# Patient Record
Sex: Female | Born: 1975 | Race: White | Hispanic: No | Marital: Single | Smoking: Current every day smoker
Health system: Southern US, Community
[De-identification: ages and names within clinical notes are randomized; demographics above are authoritative.]

## PROBLEM LIST (undated history)

## (undated) ENCOUNTER — Inpatient Hospital Stay (HOSPITAL_COMMUNITY): Payer: Self-pay

## (undated) DIAGNOSIS — L0292 Furuncle, unspecified: Secondary | ICD-10-CM

## (undated) DIAGNOSIS — F419 Anxiety disorder, unspecified: Secondary | ICD-10-CM

## (undated) DIAGNOSIS — A4902 Methicillin resistant Staphylococcus aureus infection, unspecified site: Secondary | ICD-10-CM

## (undated) HISTORY — DX: Furuncle, unspecified: L02.92

## (undated) HISTORY — PX: ABCESS DRAINAGE: SHX399

## (undated) HISTORY — DX: Anxiety disorder, unspecified: F41.9

---
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
## 2011-05-26 NOTE — L&D Delivery Note (Signed)
Delivery Note At 12:04 AM a viable female was delivered via  (Presentation: ROA, Vertex ;  ).  APGAR:  8-9 , ; weight 5 lb 15 oz (2693 g).   Placenta status: none , .  Cord: none  with the following complications: none .  Cord pH: none  Anesthesia: Epidural  Episiotomy: None Lacerations:2nd Degree  Suture Repair: 2.0 chromic Est. Blood Loss (mL): 400  Mom to postpartum.  Baby to nursery-stable.  Aleksei Goodlin A 11/18/2011, 12:43 AM

## 2011-06-02 LAB — OB RESULTS CONSOLE ABO/RH: RH Type: POSITIVE

## 2011-06-02 LAB — OB RESULTS CONSOLE HIV ANTIBODY (ROUTINE TESTING): HIV: NONREACTIVE

## 2011-06-02 LAB — OB RESULTS CONSOLE GC/CHLAMYDIA: Gonorrhea: NEGATIVE

## 2011-06-02 LAB — OB RESULTS CONSOLE HEPATITIS B SURFACE ANTIGEN: Hepatitis B Surface Ag: NEGATIVE

## 2011-06-02 LAB — OB RESULTS CONSOLE RUBELLA ANTIBODY, IGM: Rubella: IMMUNE

## 2011-06-02 LAB — OB RESULTS CONSOLE ANTIBODY SCREEN: Antibody Screen: NEGATIVE

## 2011-06-16 ENCOUNTER — Inpatient Hospital Stay (HOSPITAL_COMMUNITY)
Admission: AD | Admit: 2011-06-16 | Discharge: 2011-06-16 | Disposition: A | Discharge status: Home / Self Care | Payer: Medicaid Other | Source: Ambulatory Visit | Attending: Obstetrics | Admitting: Obstetrics

## 2011-06-16 ENCOUNTER — Encounter (HOSPITAL_COMMUNITY): Payer: Self-pay | Admitting: *Deleted

## 2011-06-16 DIAGNOSIS — R51 Headache: Secondary | ICD-10-CM | POA: Insufficient documentation

## 2011-06-16 DIAGNOSIS — O99891 Other specified diseases and conditions complicating pregnancy: Secondary | ICD-10-CM | POA: Insufficient documentation

## 2011-06-16 DIAGNOSIS — O26899 Other specified pregnancy related conditions, unspecified trimester: Secondary | ICD-10-CM

## 2011-06-16 MED ORDER — ONDANSETRON 8 MG PO TBDP: 8.0000 mg | ORAL_TABLET | Freq: Once | ORAL | Status: DC

## 2011-06-16 MED ORDER — DEXAMETHASONE 0.5 MG PO TABS
1.0000 mg | ORAL_TABLET | Freq: Once | ORAL | Status: AC
  Administered 2011-06-16: 1 mg via ORAL
  Filled 2011-06-16: qty 2

## 2011-06-16 MED ORDER — METOCLOPRAMIDE HCL 10 MG PO TABS
10.0000 mg | ORAL_TABLET | Freq: Once | ORAL | Status: AC
  Administered 2011-06-16: 10 mg via ORAL
  Filled 2011-06-16: qty 1

## 2011-06-16 MED ORDER — PROMETHAZINE HCL 25 MG PO TABS: 25.0000 mg | ORAL_TABLET | Freq: Four times a day (QID) | ORAL | Status: AC | PRN

## 2011-06-16 MED ORDER — DIPHENHYDRAMINE HCL 25 MG PO CAPS
25.0000 mg | ORAL_CAPSULE | Freq: Once | ORAL | Status: AC
  Administered 2011-06-16: 25 mg via ORAL
  Filled 2011-06-16: qty 1

## 2011-06-16 MED ORDER — BUTALBITAL-APAP-CAFFEINE 50-325-40 MG PO TABS: 1.0000 | ORAL_TABLET | Freq: Four times a day (QID) | ORAL | Status: AC | PRN

## 2011-06-16 NOTE — Progress Notes (Signed)
Pt states, "I got up this morning and was a little dizzy and had a headache. My work said I had to have a note to return to work since I was pregnant."

## 2011-06-16 NOTE — ED Provider Notes (Signed)
History     Chief Complaint  Patient presents with  . Headache  . Dizziness   HPI  Pt is [redacted] weeks pregnant and presents with headache.  She states that she has had the headache for 2 days.  The only comfort that she has gotten is when she lays down and goes to sleep.  She has had dizziness and pulsating sensation mostly on the left side.  She denies blurred vision, but has felt dizzy and felt like she was going to pass out.  She is a pt of Dr. Thomes Lolling but was not able to get an appointment at the office.  Past Medical History  Diagnosis Date  . No pertinent past medical history     Past Surgical History  Procedure Date  . Knee surgery     left  . Ankle surgery     right    Family History  Problem Relation Age of Onset  . Anesthesia problems Neg Hx     History  Substance Use Topics  . Smoking status: Current Everyday Smoker    Types: Cigarettes  . Smokeless tobacco: Not on file  . Alcohol Use: No    Allergies:  Allergies  Allergen Reactions  . Bactrim Other (See Comments)    Reaction: little sores    Prescriptions prior to admission  Medication Sig Dispense Refill  . acetaminophen (TYLENOL) 500 MG tablet Take 1,000 mg by mouth every 6 (six) hours as needed. Takes for headaches      . Prenatal Vit-Fe Fumarate-FA (PRENATAL MULTIVITAMIN) TABS Take 1 tablet by mouth daily.        Review of Systems  Constitutional: Negative for fever and chills.  HENT: Negative for hearing loss.   Respiratory: Negative for cough.   Cardiovascular: Negative for chest pain.  Gastrointestinal: Negative for heartburn, nausea, vomiting, abdominal pain, diarrhea and constipation.  Genitourinary: Negative for dysuria and urgency.  Neurological: Positive for headaches. Negative for dizziness.  Psychiatric/Behavioral: Negative for depression.   Physical Exam   Blood pressure 98/62, pulse 92, temperature 99.2 F (37.3 C), temperature source Oral, resp. rate 20, height 5\' 5"  (1.651 m),  weight 161 lb (73.029 kg), last menstrual period 02/17/2011.  Physical Exam  Vitals reviewed. Constitutional: She is oriented to person, place, and time. She appears well-developed and well-nourished.  HENT:  Head: Normocephalic.  Eyes: Pupils are equal, round, and reactive to light.  Neck: Normal range of motion. Neck supple.  Cardiovascular: Normal rate.   Respiratory: Effort normal.  GI: Soft.  Musculoskeletal: Normal range of motion.  Neurological: She is alert and oriented to person, place, and time.  Skin: Skin is warm and dry.  Psychiatric: She has a normal mood and affect.    MAU Course  Procedures  Headache protocol given to pt with Benadryl, Decadron and Reglan- pt got improvement of headache Assessment and Plan  Headache in pregnancy D/c home f/u with Dr. Rubin Payor 06/16/2011, 5:49 PM

## 2011-10-09 ENCOUNTER — Other Ambulatory Visit: Payer: Self-pay | Admitting: Obstetrics

## 2011-10-09 DIAGNOSIS — O365990 Maternal care for other known or suspected poor fetal growth, unspecified trimester, not applicable or unspecified: Secondary | ICD-10-CM

## 2011-10-12 ENCOUNTER — Ambulatory Visit (HOSPITAL_COMMUNITY)
Admission: RE | Admit: 2011-10-12 | Discharge: 2011-10-12 | Disposition: A | Discharge status: Home / Self Care | Payer: Medicaid Other | Source: Ambulatory Visit | Attending: Obstetrics | Admitting: Obstetrics

## 2011-10-12 DIAGNOSIS — Z3689 Encounter for other specified antenatal screening: Secondary | ICD-10-CM | POA: Insufficient documentation

## 2011-10-12 DIAGNOSIS — O09529 Supervision of elderly multigravida, unspecified trimester: Secondary | ICD-10-CM | POA: Insufficient documentation

## 2011-10-12 DIAGNOSIS — O365990 Maternal care for other known or suspected poor fetal growth, unspecified trimester, not applicable or unspecified: Secondary | ICD-10-CM

## 2011-10-24 LAB — HM PAP SMEAR

## 2011-11-04 ENCOUNTER — Other Ambulatory Visit: Payer: Self-pay | Admitting: Obstetrics

## 2011-11-12 ENCOUNTER — Encounter (HOSPITAL_COMMUNITY): Payer: Self-pay | Admitting: *Deleted

## 2011-11-12 ENCOUNTER — Telehealth (HOSPITAL_COMMUNITY): Payer: Self-pay | Admitting: *Deleted

## 2011-11-12 NOTE — Telephone Encounter (Signed)
Preadmission screen  

## 2011-11-17 ENCOUNTER — Inpatient Hospital Stay (HOSPITAL_COMMUNITY): Payer: Medicaid Other | Admitting: Anesthesiology

## 2011-11-17 ENCOUNTER — Encounter (HOSPITAL_COMMUNITY): Payer: Self-pay

## 2011-11-17 ENCOUNTER — Inpatient Hospital Stay (HOSPITAL_COMMUNITY)
Admission: RE | Admit: 2011-11-17 | Discharge: 2011-11-20 | DRG: 775 | Disposition: A | Discharge status: Home / Self Care | Payer: Medicaid Other | Source: Ambulatory Visit | Attending: Obstetrics | Admitting: Obstetrics

## 2011-11-17 ENCOUNTER — Encounter (HOSPITAL_COMMUNITY): Payer: Self-pay | Admitting: Anesthesiology

## 2011-11-17 DIAGNOSIS — O09529 Supervision of elderly multigravida, unspecified trimester: Secondary | ICD-10-CM | POA: Diagnosis present

## 2011-11-17 LAB — CBC
HCT: 36.2 % (ref 36.0–46.0)
Hemoglobin: 12.5 g/dL (ref 12.0–15.0)
MCH: 30.9 pg (ref 26.0–34.0)
MCHC: 34.5 g/dL (ref 30.0–36.0)

## 2011-11-17 MED ORDER — NALBUPHINE HCL 10 MG/ML IJ SOLN: 10.0000 mg | Freq: Four times a day (QID) | INTRAMUSCULAR | Status: DC | PRN

## 2011-11-17 MED ORDER — NALBUPHINE HCL 10 MG/ML IJ SOLN: 10.0000 mg | INTRAMUSCULAR | Status: DC | PRN

## 2011-11-17 MED ORDER — OXYCODONE-ACETAMINOPHEN 5-325 MG PO TABS: 1.0000 | ORAL_TABLET | ORAL | Status: DC | PRN

## 2011-11-17 MED ORDER — LACTATED RINGERS IV SOLN
INTRAVENOUS | Status: DC
  Administered 2011-11-17: 125 mL/h via INTRAVENOUS
  Administered 2011-11-17: 1000 mL via INTRAVENOUS
  Administered 2011-11-17: 23:00:00 via INTRAVENOUS

## 2011-11-17 MED ORDER — MISOPROSTOL 25 MCG QUARTER TABLET
25.0000 ug | ORAL_TABLET | Freq: Once | ORAL | Status: AC
  Administered 2011-11-17: 25 ug via VAGINAL
  Filled 2011-11-17: qty 0.25

## 2011-11-17 MED ORDER — IBUPROFEN 600 MG PO TABS: 600.0000 mg | ORAL_TABLET | Freq: Four times a day (QID) | ORAL | Status: DC | PRN

## 2011-11-17 MED ORDER — NALBUPHINE SYRINGE 5 MG/0.5 ML
10.0000 mg | INJECTION | Freq: Four times a day (QID) | INTRAMUSCULAR | Status: DC | PRN
  Administered 2011-11-17: 10 mg via INTRAMUSCULAR
  Filled 2011-11-17: qty 1
  Filled 2011-11-17: qty 2

## 2011-11-17 MED ORDER — LORAZEPAM 2 MG/ML IJ SOLN
0.5000 mg | Freq: Once | INTRAMUSCULAR | Status: AC
  Administered 2011-11-17: 0.5 mg via INTRAVENOUS
  Filled 2011-11-17: qty 1

## 2011-11-17 MED ORDER — FLEET ENEMA 7-19 GM/118ML RE ENEM: 1.0000 | ENEMA | RECTAL | Status: DC | PRN

## 2011-11-17 MED ORDER — OXYTOCIN 40 UNITS IN LACTATED RINGERS INFUSION - SIMPLE MED: 62.5000 mL/h | Freq: Once | INTRAVENOUS | Status: DC

## 2011-11-17 MED ORDER — TERBUTALINE SULFATE 1 MG/ML IJ SOLN: 0.2500 mg | Freq: Once | INTRAMUSCULAR | Status: AC | PRN

## 2011-11-17 MED ORDER — CITRIC ACID-SODIUM CITRATE 334-500 MG/5ML PO SOLN: 30.0000 mL | ORAL | Status: DC | PRN

## 2011-11-17 MED ORDER — DIPHENHYDRAMINE HCL 50 MG/ML IJ SOLN: 12.5000 mg | INTRAMUSCULAR | Status: DC | PRN

## 2011-11-17 MED ORDER — OXYTOCIN BOLUS FROM INFUSION
250.0000 mL | Freq: Once | INTRAVENOUS | Status: DC
  Filled 2011-11-17: qty 500

## 2011-11-17 MED ORDER — ACETAMINOPHEN 325 MG PO TABS: 650.0000 mg | ORAL_TABLET | ORAL | Status: DC | PRN

## 2011-11-17 MED ORDER — LACTATED RINGERS IV SOLN: 500.0000 mL | Freq: Once | INTRAVENOUS | Status: DC

## 2011-11-17 MED ORDER — OXYTOCIN 40 UNITS IN LACTATED RINGERS INFUSION - SIMPLE MED
1.0000 m[IU]/min | INTRAVENOUS | Status: DC
  Administered 2011-11-17: 1 m[IU]/min via INTRAVENOUS
  Administered 2011-11-17: 3 m[IU]/min via INTRAVENOUS
  Administered 2011-11-17: 4 m[IU]/min via INTRAVENOUS
  Administered 2011-11-17: 2 m[IU]/min via INTRAVENOUS
  Filled 2011-11-17: qty 1000

## 2011-11-17 MED ORDER — PHENYLEPHRINE 40 MCG/ML (10ML) SYRINGE FOR IV PUSH (FOR BLOOD PRESSURE SUPPORT): 80.0000 ug | PREFILLED_SYRINGE | INTRAVENOUS | Status: DC | PRN

## 2011-11-17 MED ORDER — LACTATED RINGERS IV SOLN
500.0000 mL | INTRAVENOUS | Status: DC | PRN
  Administered 2011-11-17 (×2): 1000 mL via INTRAVENOUS

## 2011-11-17 MED ORDER — LIDOCAINE HCL (PF) 1 % IJ SOLN
INTRAMUSCULAR | Status: DC | PRN
  Administered 2011-11-17 (×2): 5 mL

## 2011-11-17 MED ORDER — NALBUPHINE SYRINGE 5 MG/0.5 ML
10.0000 mg | INJECTION | INTRAMUSCULAR | Status: DC | PRN
  Administered 2011-11-17: 10 mg via INTRAVENOUS
  Filled 2011-11-17: qty 1

## 2011-11-17 MED ORDER — HYDROXYZINE HCL 50 MG/ML IM SOLN: 50.0000 mg | Freq: Four times a day (QID) | INTRAMUSCULAR | Status: DC | PRN

## 2011-11-17 MED ORDER — ONDANSETRON HCL 4 MG/2ML IJ SOLN: 4.0000 mg | Freq: Four times a day (QID) | INTRAMUSCULAR | Status: DC | PRN

## 2011-11-17 MED ORDER — LIDOCAINE HCL (PF) 1 % IJ SOLN
30.0000 mL | INTRAMUSCULAR | Status: DC | PRN
  Administered 2011-11-18: 30 mL via SUBCUTANEOUS
  Filled 2011-11-17: qty 30

## 2011-11-17 MED ORDER — FENTANYL 2.5 MCG/ML BUPIVACAINE 1/10 % EPIDURAL INFUSION (WH - ANES)
14.0000 mL/h | INTRAMUSCULAR | Status: DC
  Administered 2011-11-17 (×3): 14 mL/h via EPIDURAL
  Filled 2011-11-17 (×3): qty 60

## 2011-11-17 MED ORDER — PHENYLEPHRINE 40 MCG/ML (10ML) SYRINGE FOR IV PUSH (FOR BLOOD PRESSURE SUPPORT)
80.0000 ug | PREFILLED_SYRINGE | INTRAVENOUS | Status: DC | PRN
  Filled 2011-11-17: qty 5

## 2011-11-17 MED ORDER — EPHEDRINE 5 MG/ML INJ: 10.0000 mg | INTRAVENOUS | Status: DC | PRN

## 2011-11-17 MED ORDER — EPHEDRINE 5 MG/ML INJ
10.0000 mg | INTRAVENOUS | Status: DC | PRN
  Filled 2011-11-17: qty 4

## 2011-11-17 NOTE — Progress Notes (Signed)
Lindsay Espinoza is a 36 y.o. G3P1011 at [redacted]w[redacted]d by LMP admitted for induction of labor due to Elective at term.  Subjective:   Objective: BP 111/71  Pulse 79  Temp 98 F (36.7 C) (Oral)  Resp 18  Ht 5\' 5"  (1.651 m)  Wt 78.019 kg (172 lb)  BMI 28.62 kg/m2  SpO2 98%  LMP 02/17/2011 I/O last 3 completed shifts: In: -  Out: 300 [Urine:300] Total I/O In: -  Out: 1000 [Urine:1000]  FHT:  FHR: 150 bpm, variability: moderate,  accelerations:  Present,  decelerations:  Present variables, UC:   regular, every 3 minutes SVE:   Dilation: 9 Effacement (%): 100 Station: 0;+1 Exam by:: [employer] RN  Labs: Lab Results  Component Value Date   WBC 8.9 11/17/2011   HGB 12.5 11/17/2011   HCT 36.2 11/17/2011   MCV 89.4 11/17/2011   PLT 143* 11/17/2011    Assessment / Plan: Augmentation of labor, progressing well  Labor: Progressing normally Preeclampsia:  n/a Fetal Wellbeing:  Category II Pain Control:  Epidural I/D:  n/a Anticipated MOD:  NSVD  Berl Bonfanti A 11/17/2011, 11:33 PM

## 2011-11-17 NOTE — H&P (Signed)
Lindsay Espinoza is a 36 y.o. female presenting for elective IOL.. Maternal Medical History:  Reason for admission: Reason for admission: contractions.  36 yo G3 P1 at 39.0 weeks presents for elective IOL.  Contractions: Frequency: irregular.    Fetal activity: Perceived fetal activity is normal.   Last perceived fetal movement was within the past hour.    Prenatal complications: no prenatal complications Prenatal Complications - Diabetes: none.    OB History    Grav Para Term Preterm Abortions TAB SAB Ect Mult Living   3 1 1  0 1 1 0 0 0 1     Past Medical History  Diagnosis Date  . No pertinent past medical history    Past Surgical History  Procedure Date  . Knee surgery     left  . Ankle surgery     right   Family History: family history includes Alcohol abuse in her paternal grandfather; Diabetes in her paternal aunt and paternal grandmother; Heart attack in her maternal grandfather; Stroke in her paternal grandfather; and Vision loss in her paternal grandmother.  There is no history of Anesthesia problems. Social History:  reports that she has been smoking Cigarettes.  She has been smoking about .5 packs per day. She has never used smokeless tobacco. She reports that she does not drink alcohol or use illicit drugs.   Prenatal Transfer Tool  Maternal Diabetes: No Genetic Screening: Normal Maternal Ultrasounds/Referrals: Normal Fetal Ultrasounds or other Referrals:  None Maternal Substance Abuse:  No Significant Maternal Medications:  None Significant Maternal Lab Results:  Lab values include: Other: see prenatal record Other Comments:  None  Review of Systems  All other systems reviewed and are negative.    Dilation: 2 Effacement (%): 50 Station: -2 Exam by:: dherr rn Blood pressure 108/66, pulse 72, temperature 97.9 F (36.6 C), temperature source Oral, resp. rate 18, height 5\' 5"  (1.651 m), weight 78.019 kg (172 lb), last menstrual period  02/17/2011. Maternal Exam:  Uterine Assessment: Contraction strength is mild.  Contraction frequency is irregular.   Abdomen: Patient reports no abdominal tenderness. Fetal presentation: vertex  Introitus: Normal vulva. Normal vagina.    Physical Exam  Nursing note and vitals reviewed. Constitutional: She is oriented to person, place, and time. She appears well-developed and well-nourished.  HENT:  Head: Normocephalic and atraumatic.  Eyes: Conjunctivae are normal. Pupils are equal, round, and reactive to light.  Neck: Normal range of motion. Neck supple.  Cardiovascular: Normal rate and regular rhythm.   Respiratory: Effort normal and breath sounds normal.  GI: Soft.  Genitourinary: Vagina normal and uterus normal.  Musculoskeletal: Normal range of motion.  Neurological: She is alert and oriented to person, place, and time.  Skin: Skin is warm and dry.  Psychiatric: She has a normal mood and affect. Her behavior is normal. Judgment and thought content normal.    Prenatal labs: ABO, Rh: A/--/-- (01/08 0000) Antibody: Negative (01/08 0000) Rubella: Immune (01/08 0000) RPR: Nonreactive (01/08 0000)  HBsAg: Negative (01/08 0000)  HIV: Non-reactive (01/08 0000)  GBS: Negative (05/31 0000)   Assessment/Plan: 39 weeks.  Multiparity.  Elective IOL.   Lindsay Espinoza A 11/17/2011, 12:06 PM

## 2011-11-17 NOTE — Anesthesia Preprocedure Evaluation (Signed)

## 2011-11-17 NOTE — Anesthesia Procedure Notes (Signed)
Epidural Patient location during procedure: OB Start time: 11/17/2011 1:47 PM  Staffing Anesthesiologist: Brayton Caves R Performed by: anesthesiologist   Preanesthetic Checklist Completed: patient identified, site marked, surgical consent, pre-op evaluation, timeout performed, IV checked, risks and benefits discussed and monitors and equipment checked  Epidural Patient position: sitting Prep: site prepped and draped and DuraPrep Patient monitoring: continuous pulse ox and blood pressure Approach: midline Injection technique: LOR air and LOR saline  Needle:  Needle type: Tuohy  Needle gauge: 17 G Needle length: 9 cm Needle insertion depth: 5 cm cm Catheter type: closed end flexible Catheter size: 19 Gauge Catheter at skin depth: 10 cm Test dose: negative  Assessment Events: blood not aspirated, injection not painful, no injection resistance, negative IV test and no paresthesia  Additional Notes Patient identified.  Risk benefits discussed including failed block, incomplete pain control, headache, nerve damage, paralysis, blood pressure changes, nausea, vomiting, reactions to medication both toxic or allergic, and postpartum back pain.  Patient expressed understanding and wished to proceed.  All questions were answered.  Sterile technique used throughout procedure and epidural site dressed with sterile barrier dressing. No paresthesia or other complications noted.The patient did not experience any signs of intravascular injection such as tinnitus or metallic taste in mouth nor signs of intrathecal spread such as rapid motor block. Please see nursing notes for vital signs.

## 2011-11-17 NOTE — Progress Notes (Signed)
Lindsay Espinoza is a 36 y.o. G3P1011 at [redacted]w[redacted]d by LMP admitted for induction of labor due to Elective at term and multiparity.  Subjective:   Objective: BP 106/63  Pulse 66  Temp 98 F (36.7 C) (Oral)  Resp 18  Ht 5\' 5"  (1.651 m)  Wt 78.019 kg (172 lb)  BMI 28.62 kg/m2  LMP 02/17/2011      FHT:  FHR: 150 bpm, variability: moderate,  accelerations:  Present,  decelerations:  Absent UC:   regular, every 3 minutes SVE:   Dilation: 2.5 Effacement (%): 70 Station: -2 Exam by:: dherr rn  Labs: Lab Results  Component Value Date   WBC 8.9 11/17/2011   HGB 12.5 11/17/2011   HCT 36.2 11/17/2011   MCV 89.4 11/17/2011   PLT 143* 11/17/2011    Assessment / Plan: Induction of labor due to term with favorable cervix and multiparity,  progressing well on pitocin  Labor: Progressing normally Preeclampsia:  no signs or symptoms of toxicity Fetal Wellbeing:  Category I Pain Control:  Nubain I/D:  n/a Anticipated MOD:  NSVD  Lindsay Espinoza A 11/17/2011, 1:18 PM

## 2011-11-18 ENCOUNTER — Encounter (HOSPITAL_COMMUNITY): Payer: Self-pay

## 2011-11-18 LAB — CBC
HCT: 29.9 % — ABNORMAL LOW (ref 36.0–46.0)
Hemoglobin: 10.4 g/dL — ABNORMAL LOW (ref 12.0–15.0)
MCHC: 34.8 g/dL (ref 30.0–36.0)
RBC: 3.32 MIL/uL — ABNORMAL LOW (ref 3.87–5.11)

## 2011-11-18 MED ORDER — ZOLPIDEM TARTRATE 5 MG PO TABS: 5.0000 mg | ORAL_TABLET | Freq: Every evening | ORAL | Status: DC | PRN

## 2011-11-18 MED ORDER — BENZOCAINE-MENTHOL 20-0.5 % EX AERO
1.0000 "application " | INHALATION_SPRAY | CUTANEOUS | Status: DC | PRN
  Administered 2011-11-18: 1 via TOPICAL
  Filled 2011-11-18: qty 56

## 2011-11-18 MED ORDER — ONDANSETRON HCL 4 MG/2ML IJ SOLN: 4.0000 mg | INTRAMUSCULAR | Status: DC | PRN

## 2011-11-18 MED ORDER — IBUPROFEN 600 MG PO TABS
600.0000 mg | ORAL_TABLET | Freq: Four times a day (QID) | ORAL | Status: DC
  Administered 2011-11-18: 600 mg via ORAL
  Filled 2011-11-18: qty 1

## 2011-11-18 MED ORDER — TETANUS-DIPHTH-ACELL PERTUSSIS 5-2.5-18.5 LF-MCG/0.5 IM SUSP: 0.5000 mL | Freq: Once | INTRAMUSCULAR | Status: DC

## 2011-11-18 MED ORDER — METHYLERGONOVINE MALEATE 0.2 MG/ML IJ SOLN: 0.2000 mg | INTRAMUSCULAR | Status: DC | PRN

## 2011-11-18 MED ORDER — DIPHENHYDRAMINE HCL 25 MG PO CAPS: 25.0000 mg | ORAL_CAPSULE | Freq: Four times a day (QID) | ORAL | Status: DC | PRN

## 2011-11-18 MED ORDER — DIBUCAINE 1 % RE OINT: 1.0000 "application " | TOPICAL_OINTMENT | RECTAL | Status: DC | PRN

## 2011-11-18 MED ORDER — WITCH HAZEL-GLYCERIN EX PADS: 1.0000 "application " | MEDICATED_PAD | CUTANEOUS | Status: DC | PRN

## 2011-11-18 MED ORDER — OXYCODONE-ACETAMINOPHEN 5-325 MG PO TABS
1.0000 | ORAL_TABLET | ORAL | Status: DC | PRN
  Administered 2011-11-18: 2 via ORAL
  Administered 2011-11-18: 1 via ORAL
  Administered 2011-11-18 (×2): 2 via ORAL
  Administered 2011-11-18 (×2): 1 via ORAL
  Administered 2011-11-19 (×3): 2 via ORAL
  Administered 2011-11-19: 1 via ORAL
  Administered 2011-11-20 (×3): 2 via ORAL
  Filled 2011-11-18: qty 1
  Filled 2011-11-18 (×3): qty 2
  Filled 2011-11-18: qty 1
  Filled 2011-11-18: qty 2
  Filled 2011-11-18: qty 1
  Filled 2011-11-18 (×6): qty 2

## 2011-11-18 MED ORDER — SIMETHICONE 80 MG PO CHEW: 80.0000 mg | CHEWABLE_TABLET | ORAL | Status: DC | PRN

## 2011-11-18 MED ORDER — PRENATAL MULTIVITAMIN CH
1.0000 | ORAL_TABLET | Freq: Every day | ORAL | Status: DC
  Administered 2011-11-18 – 2011-11-20 (×3): 1 via ORAL
  Filled 2011-11-18 (×3): qty 1

## 2011-11-18 MED ORDER — METHYLERGONOVINE MALEATE 0.2 MG PO TABS: 0.2000 mg | ORAL_TABLET | ORAL | Status: DC | PRN

## 2011-11-18 MED ORDER — OXYTOCIN 40 UNITS IN LACTATED RINGERS INFUSION - SIMPLE MED: 62.5000 mL/h | INTRAVENOUS | Status: DC | PRN

## 2011-11-18 MED ORDER — LANOLIN HYDROUS EX OINT: TOPICAL_OINTMENT | CUTANEOUS | Status: DC | PRN

## 2011-11-18 MED ORDER — MEDROXYPROGESTERONE ACETATE 150 MG/ML IM SUSP: 150.0000 mg | INTRAMUSCULAR | Status: DC | PRN

## 2011-11-18 MED ORDER — SENNOSIDES-DOCUSATE SODIUM 8.6-50 MG PO TABS
2.0000 | ORAL_TABLET | Freq: Every day | ORAL | Status: DC
  Administered 2011-11-18 – 2011-11-19 (×2): 2 via ORAL

## 2011-11-18 MED ORDER — ONDANSETRON HCL 4 MG PO TABS: 4.0000 mg | ORAL_TABLET | ORAL | Status: DC | PRN

## 2011-11-18 NOTE — Anesthesia Postprocedure Evaluation (Signed)
  Anesthesia Post-op Note  Patient: Lindsay Espinoza  Procedure(s) Performed: * No procedures listed *  Patient Location: PACU and Mother/Baby  Anesthesia Type: Epidural  Level of Consciousness: awake, alert  and oriented  Airway and Oxygen Therapy: Patient Spontanous Breathing  Post-op Pain: mild  Post-op Assessment: Patient's Cardiovascular Status Stable, Respiratory Function Stable, No signs of Nausea or vomiting and Pain level controlled  Post-op Vital Signs: stable  Complications: No apparent anesthesia complications

## 2011-11-18 NOTE — Progress Notes (Signed)
Post Partum Day 1 Subjective: no complaints  Objective: Blood pressure 119/78, pulse 73, temperature 98.7 F (37.1 C), temperature source Oral, resp. rate 18, height 5\' 5"  (1.651 m), weight 78.019 kg (172 lb), last menstrual period 02/17/2011, SpO2 97.00%, unknown if currently breastfeeding.  Physical Exam:  General: alert and no distress Lochia: appropriate Uterine Fundus: firm Incision: healing well DVT Evaluation: No evidence of DVT seen on physical exam.   Basename 11/18/11 0524 11/17/11 0640  HGB 10.4* 12.5  HCT 29.9* 36.2    Assessment/Plan: Plan for discharge tomorrow   LOS: 1 day   Branton Einstein A 11/18/2011, 8:27 AM

## 2011-11-18 NOTE — Progress Notes (Signed)
UR chart review completed.  

## 2011-11-19 MED ORDER — LACTATED RINGERS IV SOLN: INTRAVENOUS | Status: DC

## 2011-11-19 MED ORDER — MISOPROSTOL 25 MCG QUARTER TABLET: 25.0000 ug | ORAL_TABLET | ORAL | Status: DC | PRN

## 2011-11-19 MED ORDER — ONDANSETRON HCL 4 MG/2ML IJ SOLN: 4.0000 mg | Freq: Four times a day (QID) | INTRAMUSCULAR | Status: DC | PRN

## 2011-11-19 MED ORDER — IBUPROFEN 600 MG PO TABS: 600.0000 mg | ORAL_TABLET | Freq: Four times a day (QID) | ORAL | Status: DC | PRN

## 2011-11-19 MED ORDER — CITRIC ACID-SODIUM CITRATE 334-500 MG/5ML PO SOLN: 30.0000 mL | ORAL | Status: DC | PRN

## 2011-11-19 MED ORDER — OXYTOCIN 20 UNITS IN LACTATED RINGERS INFUSION - SIMPLE: 125.0000 mL/h | Freq: Once | INTRAVENOUS | Status: DC

## 2011-11-19 MED ORDER — ACETAMINOPHEN 325 MG PO TABS: 650.0000 mg | ORAL_TABLET | ORAL | Status: DC | PRN

## 2011-11-19 MED ORDER — OXYCODONE-ACETAMINOPHEN 5-325 MG PO TABS: 1.0000 | ORAL_TABLET | ORAL | Status: DC | PRN

## 2011-11-19 MED ORDER — OXYTOCIN BOLUS FROM INFUSION
500.0000 mL | Freq: Once | INTRAVENOUS | Status: DC
  Filled 2011-11-19: qty 500

## 2011-11-19 MED ORDER — BUTORPHANOL TARTRATE 2 MG/ML IJ SOLN: 1.0000 mg | INTRAMUSCULAR | Status: DC | PRN

## 2011-11-19 MED ORDER — LACTATED RINGERS IV SOLN: 500.0000 mL | INTRAVENOUS | Status: DC | PRN

## 2011-11-19 MED ORDER — HYDROXYZINE HCL 50 MG PO TABS: 50.0000 mg | ORAL_TABLET | Freq: Four times a day (QID) | ORAL | Status: DC | PRN

## 2011-11-19 MED ORDER — LIDOCAINE HCL (PF) 1 % IJ SOLN: 30.0000 mL | INTRAMUSCULAR | Status: DC | PRN

## 2011-11-19 MED ORDER — TERBUTALINE SULFATE 1 MG/ML IJ SOLN: 0.2500 mg | Freq: Once | INTRAMUSCULAR | Status: DC | PRN

## 2011-11-19 MED ORDER — HYDROXYZINE HCL 50 MG/ML IM SOLN: 50.0000 mg | Freq: Four times a day (QID) | INTRAMUSCULAR | Status: DC | PRN

## 2011-11-19 NOTE — Discharge Summary (Signed)
Obstetric Discharge Summary Reason for Admission: induction of labor Prenatal Procedures: ultrasound Intrapartum Procedures: spontaneous vaginal delivery Postpartum Procedures: none Complications-Operative and Postpartum: none Hemoglobin  Date Value Range Status  11/18/2011 10.4* 12.0 - 15.0 g/dL Final     REPEATED TO VERIFY     DELTA CHECK NOTED     HCT  Date Value Range Status  11/18/2011 29.9* 36.0 - 46.0 % Final    Physical Exam:  General: alert and no distress Lochia: appropriate Uterine Fundus: firm Incision: none DVT Evaluation: No evidence of DVT seen on physical exam.  Discharge Diagnoses: Term Pregnancy-delivered  Discharge Information: Date: 11/19/2011 Activity: pelvic rest Diet: routine Medications: PNV, Colace and Percocet Condition: stable Instructions: refer to practice specific booklet Discharge to: home Follow-up Information    Follow up with Lindsay Espinoza A, MD. Schedule an appointment as soon as possible for a visit in 6 weeks.   Contact information:   [address] [place] [address] [phone]          Newborn Data: Live born female  Birth Weight: 5 lb 15 oz (2693 g) APGAR: 8, 9  Home with mother.  Rider Ermis A 11/19/2011, 7:35 AM

## 2011-11-19 NOTE — Progress Notes (Signed)
Post Partum Day 2 Subjective: no complaints  Objective: Blood pressure 107/67, pulse 64, temperature 97.7 F (36.5 C), temperature source Oral, resp. rate 18, height 5\' 5"  (1.651 m), weight 78.019 kg (172 lb), last menstrual period 02/17/2011, SpO2 97.00%, unknown if currently breastfeeding.  Physical Exam:  General: alert and no distress Lochia: appropriate Uterine Fundus: firm Incision: none DVT Evaluation: No evidence of DVT seen on physical exam.   Basename 11/18/11 0524 11/17/11 0640  HGB 10.4* 12.5  HCT 29.9* 36.2    Assessment/Plan: Discharge home   LOS: 2 days   Lindsay Espinoza A 11/19/2011, 7:26 AM

## 2012-07-07 ENCOUNTER — Emergency Department (HOSPITAL_COMMUNITY)
Admission: EM | Admit: 2012-07-07 | Discharge: 2012-07-07 | Disposition: A | Discharge status: Home / Self Care | Payer: Self-pay | Attending: Emergency Medicine | Admitting: Emergency Medicine

## 2012-07-07 ENCOUNTER — Encounter (HOSPITAL_COMMUNITY): Payer: Self-pay | Admitting: Nurse Practitioner

## 2012-07-07 DIAGNOSIS — F17200 Nicotine dependence, unspecified, uncomplicated: Secondary | ICD-10-CM | POA: Insufficient documentation

## 2012-07-07 DIAGNOSIS — L0291 Cutaneous abscess, unspecified: Secondary | ICD-10-CM

## 2012-07-07 DIAGNOSIS — R5381 Other malaise: Secondary | ICD-10-CM | POA: Insufficient documentation

## 2012-07-07 DIAGNOSIS — R42 Dizziness and giddiness: Secondary | ICD-10-CM | POA: Insufficient documentation

## 2012-07-07 DIAGNOSIS — R5383 Other fatigue: Secondary | ICD-10-CM | POA: Insufficient documentation

## 2012-07-07 DIAGNOSIS — L03319 Cellulitis of trunk, unspecified: Secondary | ICD-10-CM | POA: Insufficient documentation

## 2012-07-07 DIAGNOSIS — L02219 Cutaneous abscess of trunk, unspecified: Secondary | ICD-10-CM | POA: Insufficient documentation

## 2012-07-07 DIAGNOSIS — Z8614 Personal history of Methicillin resistant Staphylococcus aureus infection: Secondary | ICD-10-CM | POA: Insufficient documentation

## 2012-07-07 LAB — CBC WITH DIFFERENTIAL/PLATELET
Basophils Relative: 1 % (ref 0–1)
Eosinophils Absolute: 0.2 10*3/uL (ref 0.0–0.7)
HCT: 35.8 % — ABNORMAL LOW (ref 36.0–46.0)
Hemoglobin: 12 g/dL (ref 12.0–15.0)
Lymphs Abs: 1.6 10*3/uL (ref 0.7–4.0)
MCH: 30.1 pg (ref 26.0–34.0)
MCHC: 33.5 g/dL (ref 30.0–36.0)
Monocytes Absolute: 0.6 10*3/uL (ref 0.1–1.0)
Monocytes Relative: 10 % (ref 3–12)
Neutro Abs: 4.1 10*3/uL (ref 1.7–7.7)
Neutrophils Relative %: 63 % (ref 43–77)
RBC: 3.99 MIL/uL (ref 3.87–5.11)

## 2012-07-07 MED ORDER — OXYCODONE-ACETAMINOPHEN 5-325 MG PO TABS
1.0000 | ORAL_TABLET | Freq: Once | ORAL | Status: AC
  Administered 2012-07-07: 1 via ORAL
  Filled 2012-07-07: qty 1

## 2012-07-07 MED ORDER — CLINDAMYCIN HCL 150 MG PO CAPS: 150.0000 mg | ORAL_CAPSULE | Freq: Four times a day (QID) | ORAL | Status: DC

## 2012-07-07 MED ORDER — HYDROCODONE-ACETAMINOPHEN 5-325 MG PO TABS: 1.0000 | ORAL_TABLET | Freq: Four times a day (QID) | ORAL | Status: DC | PRN

## 2012-07-07 NOTE — ED Notes (Signed)
Pt has hx of MRSA and abscess, has abscess on left side.

## 2012-07-07 NOTE — ED Provider Notes (Signed)
  Medical screening examination/treatment/procedure(s) were performed by non-physician practitioner and as supervising physician I was immediately available for consultation/collaboration.  On my exam the patient was in no distress.  We discussed the need for ongoing evaluation of her repetitive abscesses,  the wound was incised and drained by the nurse practitioner  I saw the ECG (if appropriate), relevant labs and studies - I agree with the interpretation.    Gerhard Munch, MD 07/07/12 [phone]

## 2012-07-07 NOTE — ED Provider Notes (Signed)
History    This chart was scribed for non-physician practitioner working with Gerhard Munch, MD by Frederik Pear, ED Scribe. This patient was seen in room TR11C/TR11C and the patient's care was started at 1856.   CSN: [id number]  Arrival date & time 07/07/12  1813   First MD Initiated Contact with Patient 07/07/12 1856      Chief Complaint  Patient presents with  . Abscess    (Consider location/radiation/quality/duration/timing/severity/associated sxs/prior treatment) Patient is a 37 y.o. female presenting with abscess. The history is provided by the patient. No language interpreter was used.  Abscess Location:  Torso Torso abscess location:  L axilla and L chest Abscess quality: induration, redness and warmth   Red streaking: no   Duration:  1 week Progression:  Worsening Chronicity:  Recurrent Associated symptoms: fatigue     Lindsay Espinoza is a 37 y.o. 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 She also complains of intermittent, mild fever, dizziness, and lightheadedness. She reports that she just moved to the area and the last abscess was removed surgically at [facility]. She reports that she is allergic to bactrim and breaks out in a rash. She states that she has the best success with clindamycin and doxycycline.    Past Medical History  Diagnosis Date  . No pertinent past medical history     Past Surgical History  Procedure Laterality Date  . Knee surgery      left  . Ankle surgery      right    Family History  Problem Relation Age of Onset  . Anesthesia problems Neg Hx   . Diabetes Paternal Aunt   . Heart attack Maternal Grandfather   . Vision loss Paternal Grandmother     cataracts  . Diabetes Paternal Grandmother   . Alcohol abuse Paternal Grandfather   . Stroke Paternal Grandfather      History  Substance Use Topics  . Smoking status: Current Every Day Smoker -- 0.50 packs/day    Types: Cigarettes  . Smokeless tobacco: Never Used  . Alcohol Use: No    OB History   Grav Para Term Preterm Abortions TAB SAB Ect Mult Living   3 2 2  0 1 1 0 0 0 2      Review of Systems  Constitutional: Positive for fatigue.  Skin:       Abscess  Neurological: Positive for dizziness and light-headedness.  All other systems reviewed and are negative.    Allergies  Bactrim  Home Medications   Current Outpatient Rx  Name  Route  Sig  Dispense  Refill  . acetaminophen (TYLENOL) 325 MG tablet   Oral   Take 650 mg by mouth every 6 (six) hours as needed for pain.         Marland Kitchen doxycycline (VIBRA-TABS) 100 MG tablet   Oral   Take 100 mg by mouth 2 (two) times daily.         . naproxen sodium (ANAPROX) 220 MG tablet   Oral   Take 220 mg by mouth 2 (two) times daily with a meal.           BP 119/80  Pulse 83  Temp(Src) 98.4 F (36.9 C) (Oral)  Resp 16  Ht 5\' 5"  (1.651 m)  Wt 150 lb (68.04 kg)  BMI 24.96 kg/m2  SpO2 98%  LMP 07/07/2012  Breastfeeding? No  Physical Exam  Nursing note and vitals reviewed. Constitutional: She is oriented to person, place, and time. She appears well-developed and well-nourished. No distress.  HENT:  Head: Normocephalic and atraumatic.  Eyes: EOM are normal. Pupils are equal, round, and reactive to light.  Neck: Normal range of motion. Neck supple. No tracheal deviation present.  Cardiovascular: Normal rate.   Pulmonary/Chest: Effort normal. No respiratory distress.  There is a 4x3 cm abscess with mild warmth, and moderate induration and erythema to the left chest wall just below the axilla.   Abdominal: Soft. She exhibits no distension.  Musculoskeletal: Normal range of motion. She exhibits no edema.  Neurological: She is alert and oriented to person, place, and time.  Skin: Skin is warm and dry. There is erythema.   Psychiatric: She has a normal mood and affect. Her behavior is normal.    ED Course  Procedures (including critical care time)  DIAGNOSTIC STUDIES: Oxygen Saturation is 98% on room air, normal by my interpretation.    COORDINATION OF CARE:  18:58- Discussed planned course of treatment with the patient, including a CBC and an I&D, who is agreeable at this time.  19:15- Medication Orders- oxycodone-acetaminophen (percocet/roxicet) 50325 mg per tablet 1 tablet- once.   22:50- INCISION AND DRAINAGE PROCEDURE NOTE: Patient identification was confirmed and verbal consent was obtained. This procedure was performed by Felicie Morn, NP at 10:50 PM. Site: Left chest wall just below the axilla.  Sterile procedures observed  Needle size: N/A Anesthetic used (type and amt): 4 cc of 2% plain lidocaine.  Blade size: 11 Drainage: copious amounts of purulent clear discharge Complexity: Complex Packing used: None Site anesthetized, incision made over site, wound drained and explored loculations, rinsed with copious amounts of normal saline, covered with dry, sterile dressing.  Pt tolerated procedure well without complications.  Instructions for care discussed verbally and pt provided with additional written instructions for homecare and f/u.   Results for orders placed during the hospital encounter of 07/07/12  CBC WITH DIFFERENTIAL      Result Value Range   WBC 6.5  4.0 - 10.5 K/uL   RBC 3.99  3.87 - 5.11 MIL/uL   Hemoglobin 12.0  12.0 - 15.0 g/dL   HCT 16.1 (*) 09.6 - 04.5 %   MCV 89.7  78.0 - 100.0 fL   MCH 30.1  26.0 - 34.0 pg   MCHC 33.5  30.0 - 36.0 g/dL   RDW 40.9  81.1 - 91.4 %   Platelets 200  150 - 400 K/uL   Neutrophils Relative 63  43 - 77 %   Neutro Abs 4.1  1.7 - 7.7 K/uL   Lymphocytes Relative 24  12 - 46 %   Lymphs Abs 1.6  0.7 - 4.0 K/uL   Monocytes Relative 10  3 - 12 %   Monocytes Absolute 0.6  0.1 - 1.0 K/uL   Eosinophils Relative 3  0 - 5 %   Eosinophils  Absolute 0.2  0.0 - 0.7 K/uL   Basophils Relative 1  0 - 1 %   Basophils Absolute 0.0  0.0 - 0.1 K/uL    Labs Reviewed - No data to display No results found.   No diagnosis found.  Patient discussed with and seen by Dr. Jeraldine Loots.  Will I&D, culture.  Home with MRSA instructions.  Prescription for clindamycin to fill if she develops fever.  MDM   I personally performed the services  described in this documentation, which was scribed in my presence. The recorded information has been reviewed and is accurate.         Jimmye Norman, NP 07/07/12 [phone]

## 2012-07-07 NOTE — ED Notes (Signed)
Dry dressing placed over abscess site

## 2012-07-11 LAB — CULTURE, ROUTINE-ABSCESS

## 2012-07-12 NOTE — ED Notes (Signed)
+   urine Patient treated with Clindamhycin-senstitive to same-Chart appended per protocol MD.

## 2013-05-05 ENCOUNTER — Other Ambulatory Visit: Payer: Self-pay | Admitting: Internal Medicine

## 2013-05-05 ENCOUNTER — Telehealth: Payer: Self-pay | Admitting: Internal Medicine

## 2013-05-05 DIAGNOSIS — J019 Acute sinusitis, unspecified: Secondary | ICD-10-CM

## 2013-05-05 MED ORDER — AZITHROMYCIN 250 MG PO TABS: ORAL_TABLET | ORAL | Status: DC

## 2013-05-05 NOTE — Telephone Encounter (Signed)
Sinus infection. Plan- Zpak sent to [facility] pharmacy

## 2013-06-22 ENCOUNTER — Ambulatory Visit (INDEPENDENT_AMBULATORY_CARE_PROVIDER_SITE_OTHER): Payer: 59 | Admitting: Internal Medicine

## 2013-06-22 ENCOUNTER — Encounter: Payer: Self-pay | Admitting: Internal Medicine

## 2013-06-22 VITALS — BP 112/82 | HR 78 | Temp 98.8°F | Ht 65.0 in | Wt 145.1 lb

## 2013-06-22 DIAGNOSIS — F411 Generalized anxiety disorder: Secondary | ICD-10-CM

## 2013-06-22 DIAGNOSIS — Z0000 Encounter for general adult medical examination without abnormal findings: Secondary | ICD-10-CM

## 2013-06-22 DIAGNOSIS — L732 Hidradenitis suppurativa: Secondary | ICD-10-CM | POA: Insufficient documentation

## 2013-06-22 DIAGNOSIS — F419 Anxiety disorder, unspecified: Secondary | ICD-10-CM | POA: Insufficient documentation

## 2013-06-22 DIAGNOSIS — F17200 Nicotine dependence, unspecified, uncomplicated: Secondary | ICD-10-CM

## 2013-06-22 DIAGNOSIS — L0293 Carbuncle, unspecified: Secondary | ICD-10-CM

## 2013-06-22 DIAGNOSIS — L0292 Furuncle, unspecified: Secondary | ICD-10-CM

## 2013-06-22 MED ORDER — VARENICLINE TARTRATE 0.5 MG X 11 & 1 MG X 42 PO MISC: ORAL | Status: DC

## 2013-06-22 MED ORDER — DESVENLAFAXINE SUCCINATE ER 50 MG PO TB24: 50.0000 mg | ORAL_TABLET | Freq: Every day | ORAL | Status: DC

## 2013-06-22 MED ORDER — ALPRAZOLAM 1 MG PO TABS: 0.5000 mg | ORAL_TABLET | Freq: Two times a day (BID) | ORAL | Status: DC | PRN

## 2013-06-22 MED ORDER — DOXYCYCLINE HYCLATE 100 MG PO TABS: 100.0000 mg | ORAL_TABLET | Freq: Two times a day (BID) | ORAL | Status: DC

## 2013-06-22 NOTE — Progress Notes (Signed)
Pre-visit discussion using our clinic review tool. No additional management support is needed unless otherwise documented below in the visit note.  

## 2013-06-22 NOTE — Patient Instructions (Addendum)
It was good to see you today.  We have reviewed your prior records including labs and tests today  Health Maintenance reviewed - all recommended immunizations and age-appropriate screenings are up-to-date.  Test(s) ordered today. Return in the AM for your labs. Your results will be released to [place] (or called to you) after review, usually within 72hours after test completion. If any changes need to be made, you will be notified at that same time.  Medications reviewed and updated,  Pristiq, Chantix and Xanax -no other changes recommended at this time. Your prescription(s) have been submitted to your pharmacy. Please take as directed and contact our office if you believe you are having problem(s) with the medication(s).  Please schedule followup in 3 months for mood and smoking cessation check, call sooner if problems. Health Maintenance, Female A healthy lifestyle and preventative care can promote health and wellness.  Maintain regular health, dental, and eye exams.  Eat a healthy diet. Foods like vegetables, fruits, whole grains, low-fat dairy products, and lean protein foods contain the nutrients you need without too many calories. Decrease your intake of foods high in solid fats, added sugars, and salt. Get information about a proper diet from your caregiver, if necessary.  Regular physical exercise is one of the most important things you can do for your health. Most adults should get at least 150 minutes of moderate-intensity exercise (any activity that increases your heart rate and causes you to sweat) each week. In addition, most adults need muscle-strengthening exercises on 2 or more days a week.   Maintain a healthy weight. The body mass index (BMI) is a screening tool to identify possible weight problems. It provides an estimate of body fat based on height and weight. Your caregiver can help determine your BMI, and can help you achieve or maintain a healthy weight. For adults 20  years and older:  A BMI below 18.5 is considered underweight.  A BMI of 18.5 to 24.9 is normal.  A BMI of 25 to 29.9 is considered overweight.  A BMI of 30 and above is considered obese.  Maintain normal blood lipids and cholesterol by exercising and minimizing your intake of saturated fat. Eat a balanced diet with plenty of fruits and vegetables. Blood tests for lipids and cholesterol should begin at age 53 and be repeated every 5 years. If your lipid or cholesterol levels are high, you are over 50, or you are a high risk for heart disease, you may need your cholesterol levels checked more frequently.Ongoing high lipid and cholesterol levels should be treated with medicines if diet and exercise are not effective.  If you smoke, find out from your caregiver how to quit. If you do not use tobacco, do not start.  Lung cancer screening is recommended for adults aged 18 80 years who are at high risk for developing lung cancer because of a history of smoking. Yearly low-dose computed tomography (CT) is recommended for people who have at least a 30-pack-year history of smoking and are a current smoker or have quit within the past 15 years. A pack year of smoking is smoking an average of 1 pack of cigarettes a day for 1 year (for example: 1 pack a day for 30 years or 2 packs a day for 15 years). Yearly screening should continue until the smoker has stopped smoking for at least 15 years. Yearly screening should also be stopped for people who develop a health problem that would prevent them from having lung cancer  treatment.  If you are pregnant, do not drink alcohol. If you are breastfeeding, be very cautious about drinking alcohol. If you are not pregnant and choose to drink alcohol, do not exceed 1 drink per day. One drink is considered to be 12 ounces (355 mL) of beer, 5 ounces (148 mL) of wine, or 1.5 ounces (44 mL) of liquor.  Avoid use of street drugs. Do not share needles with anyone. Ask for help  if you need support or instructions about stopping the use of drugs.  High blood pressure causes heart disease and increases the risk of stroke. Blood pressure should be checked at least every 1 to 2 years. Ongoing high blood pressure should be treated with medicines, if weight loss and exercise are not effective.  If you are 6 to 37 years old, ask your caregiver if you should take aspirin to prevent strokes.  Diabetes screening involves taking a blood sample to check your fasting blood sugar level. This should be done once every 3 years, after age 34, if you are within normal weight and without risk factors for diabetes. Testing should be considered at a younger age or be carried out more frequently if you are overweight and have at least 1 risk factor for diabetes.  Breast cancer screening is essential preventative care for women. You should practice "breast self-awareness." This means understanding the normal appearance and feel of your breasts and may include breast self-examination. Any changes detected, no matter how small, should be reported to a caregiver. Women in their 8s and 30s should have a clinical breast exam (CBE) by a caregiver as part of a regular health exam every 1 to 3 years. After age 87, women should have a CBE every year. Starting at age 71, women should consider having a mammogram (breast X-ray) every year. Women who have a family history of breast cancer should talk to their caregiver about genetic screening. Women at a high risk of breast cancer should talk to their caregiver about having an MRI and a mammogram every year.  Breast cancer gene (BRCA)-related cancer risk assessment is recommended for women who have family members with BRCA-related cancers. BRCA-related cancers include breast, ovarian, tubal, and peritoneal cancers. Having family members with these cancers may be associated with an increased risk for harmful changes (mutations) in the breast cancer genes BRCA1 and  BRCA2. Results of the assessment will determine the need for genetic counseling and BRCA1 and BRCA2 testing.  The Pap test is a screening test for cervical cancer. Women should have a Pap test starting at age 55. Between ages 7 and 64, Pap tests should be repeated every 2 years. Beginning at age 7, you should have a Pap test every 3 years as long as the past 3 Pap tests have been normal. If you had a hysterectomy for a problem that was not cancer or a condition that could lead to cancer, then you no longer need Pap tests. If you are between ages 41 and 86, and you have had normal Pap tests going back 10 years, you no longer need Pap tests. If you have had past treatment for cervical cancer or a condition that could lead to cancer, you need Pap tests and screening for cancer for at least 20 years after your treatment. If Pap tests have been discontinued, risk factors (such as a new sexual partner) need to be reassessed to determine if screening should be resumed. Some women have medical problems that increase the chance of  getting cervical cancer. In these cases, your caregiver may recommend more frequent screening and Pap tests.  The human papillomavirus (HPV) test is an additional test that may be used for cervical cancer screening. The HPV test looks for the virus that can cause the cell changes on the cervix. The cells collected during the Pap test can be tested for HPV. The HPV test could be used to screen women aged 31 years and older, and should be used in women of any age who have unclear Pap test results. After the age of 66, women should have HPV testing at the same frequency as a Pap test.  Colorectal cancer can be detected and often prevented. Most routine colorectal cancer screening begins at the age of 50 and continues through age 60. However, your caregiver may recommend screening at an earlier age if you have risk factors for colon cancer. On a yearly basis, your caregiver may provide home  test kits to check for hidden blood in the stool. Use of a small camera at the end of a tube, to directly examine the colon (sigmoidoscopy or colonoscopy), can detect the earliest forms of colorectal cancer. Talk to your caregiver about this at age 51, when routine screening begins. Direct examination of the colon should be repeated every 5 to 10 years through age 8, unless early forms of pre-cancerous polyps or small growths are found.  Hepatitis C blood testing is recommended for all people born from 18 through 1964 and any individual with known risks for hepatitis C.  Practice safe sex. Use condoms and avoid high-risk sexual practices to reduce the spread of sexually transmitted infections (STIs). Sexually active women aged 49 and younger should be checked for Chlamydia, which is a common sexually transmitted infection. Older women with new or multiple partners should also be tested for Chlamydia. Testing for other STIs is recommended if you are sexually active and at increased risk.  Osteoporosis is a disease in which the bones lose minerals and strength with aging. This can result in serious bone fractures. The risk of osteoporosis can be identified using a bone density scan. Women ages 30 and over and women at risk for fractures or osteoporosis should discuss screening with their caregivers. Ask your caregiver whether you should be taking a calcium supplement or vitamin D to reduce the rate of osteoporosis.  Menopause can be associated with physical symptoms and risks. Hormone replacement therapy is available to decrease symptoms and risks. You should talk to your caregiver about whether hormone replacement therapy is right for you.  Use sunscreen. Apply sunscreen liberally and repeatedly throughout the day. You should seek shade when your shadow is shorter than you. Protect yourself by wearing long sleeves, pants, a wide-brimmed hat, and sunglasses year round, whenever you are outdoors.  Notify  your caregiver of new moles or changes in moles, especially if there is a change in shape or color. Also notify your caregiver if a mole is larger than the size of a pencil eraser.  Stay current with your immunizations. Document Released: 11/24/2010 Document Revised: 09/05/2012 Document Reviewed: 11/24/2010 [facility] Patient Information 2014 [place]. Smoking Cessation Quitting smoking is important to your health and has many advantages. However, it is not always easy to quit since nicotine is a very addictive drug. Often times, people try 3 times or more before being able to quit. This document explains the best ways for you to prepare to quit smoking. Quitting takes hard work and a lot of effort,  but you can do it. ADVANTAGES OF QUITTING SMOKING  You will live longer, feel better, and live better.  Your body will feel the impact of quitting smoking almost immediately.  Within 20 minutes, blood pressure decreases. Your pulse returns to its normal level.  After 8 hours, carbon monoxide levels in the blood return to normal. Your oxygen level increases.  After 24 hours, the chance of having a heart attack starts to decrease. Your breath, hair, and body stop smelling like smoke.  After 48 hours, damaged nerve endings begin to recover. Your sense of taste and smell improve.  After 72 hours, the body is virtually free of nicotine. Your bronchial tubes relax and breathing becomes easier.  After 2 to 12 weeks, lungs can hold more air. Exercise becomes easier and circulation improves.  The risk of having a heart attack, stroke, cancer, or lung disease is greatly reduced.  After 1 year, the risk of coronary heart disease is cut in half.  After 5 years, the risk of stroke falls to the same as a nonsmoker.  After 10 years, the risk of lung cancer is cut in half and the risk of other cancers decreases significantly.  After 15 years, the risk of coronary heart disease drops, usually to the  level of a nonsmoker.  If you are pregnant, quitting smoking will improve your chances of having a healthy baby.  The people you live with, especially any children, will be healthier.  You will have extra money to spend on things other than cigarettes. QUESTIONS TO THINK ABOUT BEFORE ATTEMPTING TO QUIT You may want to talk about your answers with your caregiver.  Why do you want to quit?  If you tried to quit in the past, what helped and what did not?  What will be the most difficult situations for you after you quit? How will you plan to handle them?  Who can help you through the tough times? Your family? Friends? A caregiver?  What pleasures do you get from smoking? What ways can you still get pleasure if you quit? Here are some questions to ask your caregiver:  How can you help me to be successful at quitting?  What medicine do you think would be best for me and how should I take it?  What should I do if I need more help?  What is smoking withdrawal like? How can I get information on withdrawal? GET READY  Set a quit date.  Change your environment by getting rid of all cigarettes, ashtrays, matches, and lighters in your home, car, or work. Do not let people smoke in your home.  Review your past attempts to quit. Think about what worked and what did not. GET SUPPORT AND ENCOURAGEMENT You have a better chance of being successful if you have help. You can get support in many ways.  Tell your family, friends, and co-workers that you are going to quit and need their support. Ask them not to smoke around you.  Get individual, group, or telephone counseling and support. Programs are available at [employer] and health centers. Call your local health department for information about programs in your area.  Spiritual beliefs and practices may help some smokers quit.  Download a "quit meter" on your computer to keep track of quit statistics, such as how long you have gone  without smoking, cigarettes not smoked, and money saved.  Get a self-help book about quitting smoking and staying off of tobacco. [place]  BEHAVIORS  Distract yourself from urges to smoke. Talk to someone, go for a walk, or occupy your time with a task.  Change your normal routine. Take a different route to work. Drink tea instead of coffee. Eat breakfast in a different place.  Reduce your stress. Take a hot bath, exercise, or read a book.  Plan something enjoyable to do every day. Reward yourself for not smoking.  Explore interactive web-based programs that specialize in helping you quit. GET MEDICINE AND USE IT CORRECTLY Medicines can help you stop smoking and decrease the urge to smoke. Combining medicine with the above behavioral methods and support can greatly increase your chances of successfully quitting smoking.  Nicotine replacement therapy helps deliver nicotine to your body without the negative effects and risks of smoking. Nicotine replacement therapy includes nicotine gum, lozenges, inhalers, nasal sprays, and skin patches. Some may be available over-the-counter and others require a prescription.  Antidepressant medicine helps people abstain from smoking, but how this works is unknown. This medicine is available by prescription.  Nicotinic receptor partial agonist medicine simulates the effect of nicotine in your brain. This medicine is available by prescription. Ask your caregiver for advice about which medicines to use and how to use them based on your health history. Your caregiver will tell you what side effects to look out for if you choose to be on a medicine or therapy. Carefully read the information on the package. Do not use any other product containing nicotine while using a nicotine replacement product.  RELAPSE OR DIFFICULT SITUATIONS Most relapses occur within the first 3 months after quitting. Do not be discouraged if you start smoking again. Remember,  most people try several times before finally quitting. You may have symptoms of withdrawal because your body is used to nicotine. You may crave cigarettes, be irritable, feel very hungry, cough often, get headaches, or have difficulty concentrating. The withdrawal symptoms are only temporary. They are strongest when you first quit, but they will go away within 10 14 days. To reduce the chances of relapse, try to:  Avoid drinking alcohol. Drinking lowers your chances of successfully quitting.  Reduce the amount of caffeine you consume. Once you quit smoking, the amount of caffeine in your body increases and can give you symptoms, such as a rapid heartbeat, sweating, and anxiety.  Avoid smokers because they can make you want to smoke.  Do not let weight gain distract you. Many smokers will gain weight when they quit, usually less than 10 pounds. Eat a healthy diet and stay active. You can always lose the weight gained after you quit.  Find ways to improve your mood other than smoking. FOR MORE INFORMATION  www.smokefree.gov  Document Released: 05/05/2001 Document Revised: 11/10/2011 Document Reviewed: 08/20/2011 [facility] Patient Information 2014 [place], [place].

## 2013-06-22 NOTE — Progress Notes (Signed)
Subjective:    Patient ID: Lindsay Espinoza, female    DOB: 1975-07-21, 38 y.o.   MRN: [id number]  HPI  patient is here today for annual physical. Patient feels well in general  Also reviewed chronic medical issues and current concerns:  Anxiety - chronic symptoms - remotely on Paxil, Wellbutrin, pristiq, Brintellix and prn xanax  -increasing symptoms in the past 18 months due to unexpected pregnancy and subsequent  71-month-old child - denies SI/HI  Tobacco abuse -variable success at cessation efforts in past -interested in trying Chantix -   Recurrent boils. History of MSSA and MRSA culture positive. Generally occur at axilla as well as groin and buttocks -new episode in past week, began on doxycycline for same with improvement in redness, decreased in size and decrease in tenderness   Past Medical History  Diagnosis Date  . Boils     hx MRSA   Family History  Problem Relation Age of Onset  . Anesthesia problems Neg Hx   . Diabetes Paternal Aunt 40  . Heart attack Maternal Grandfather 45    sudden  . Vision loss Paternal Grandmother     cataracts  . Diabetes Paternal Grandmother 47  . Alcohol abuse Paternal Grandfather   . Stroke Paternal Grandfather 71   History  Substance Use Topics  . Smoking status: Current Every Day Smoker -- 0.50 packs/day    Types: Cigarettes  . Smokeless tobacco: Never Used  . Alcohol Use: No    Review of Systems  Constitutional: Negative for fatigue and unexpected weight change.  Respiratory: Negative for cough, shortness of breath and wheezing.   Cardiovascular: Negative for chest pain, palpitations and leg swelling.  Gastrointestinal: Negative for nausea, abdominal pain and diarrhea.  Neurological: Negative for dizziness, weakness, light-headedness and headaches.  Psychiatric/Behavioral: Positive for sleep disturbance. Negative for suicidal ideas, self-injury and dysphoric mood. The patient is nervous/anxious.   All other systems  reviewed and are negative.       Objective:   Physical Exam BP 112/82  Pulse 78  Temp(Src) 98.8 F (37.1 C) (Oral)  Ht 5\' 5"  (1.651 m)  Wt 145 lb 1.9 oz (65.826 kg)  BMI 24.15 kg/m2  SpO2 97% Wt Readings from Last 3 Encounters:  06/22/13 145 lb 1.9 oz (65.826 kg)  07/07/12 150 lb (68.04 kg)  11/17/11 172 lb (78.019 kg)   Constitutional: She appears well-developed and well-nourished. No distress.  HENT: Head: Normocephalic and atraumatic. Ears: B TMs ok, no erythema or effusion; Nose: Nose normal. Mouth/Throat: Oropharynx is clear and moist. No oropharyngeal exudate.  Eyes: Conjunctivae and EOM are normal. Pupils are equal, round, and reactive to light. No scleral icterus.  Neck: Normal range of motion. Neck supple. No JVD present. No thyromegaly present.  Cardiovascular: Normal rate, regular rhythm and normal heart sounds.  No murmur heard. No BLE edema. Pulmonary/Chest: Effort normal and breath sounds normal. No respiratory distress. She has no wheezes.  Abdominal: Soft. Bowel sounds are normal. She exhibits no distension. There is no tenderness. no masses Musculoskeletal: Normal range of motion, no joint effusions. No gross deformities Neurological: She is alert and oriented to person, place, and time. No cranial nerve deficit. Coordination, balance, strength, speech and gait are normal.  Skin: Noninflamed boil L axilla - not warm or red, no spont drainage. Remaining skin is warm and dry. No rash noted. No erythema.  Psychiatric: She has an expansive, min anxious mood and affect. Her behavior is normal. Judgment and thought content normal.  Lab Results  Component Value Date   WBC 6.5 07/07/2012   HGB 12.0 07/07/2012   HCT 35.8* 07/07/2012   PLT 200 07/07/2012        Assessment & Plan:   CPX/v70.0 - Patient has been counseled on age-appropriate routine health concerns for screening and prevention. These are reviewed and up-to-date. Immunizations are up-to-date or declined.  Labs ordered and reviewed.  tobacco abuse - 5 minutes today spent counseling patient on unhealthy effects of continued tobacco abuse and encouragement of cessation including medical options available to help the patient quit smoking. Will rx Chantix - to follow up in 3 mo on same, call sooner if side effects or other problems   Also see problem list. Medications and labs reviewed today.

## 2013-06-22 NOTE — Assessment & Plan Note (Signed)
Recurrent hx same, currently on doxy and improved Will provide standing doxy to use prn, ?HS flare Pt agrees to call if symptoms not responding to doxy or increase in frequency, currently 1-2x/yr

## 2013-06-22 NOTE — Assessment & Plan Note (Signed)
chronic symptoms - previousy on Paxil, Wellbutrin, pristiq, Brintellix and prn xanax  increasing symptoms in the past 18 months due to unexpected pregnancy and subsequent 2750-month-old child - denies SI/HI Discussion of potential med options - pt would like to resume Pristiq with prn Xanax we reviewed potential risk/benefit and possible side effects - pt understands and agrees to same  Erx done - follow up 3 mo on meds and symptoms, sooner if problems

## 2013-06-23 ENCOUNTER — Telehealth: Payer: Self-pay | Admitting: Internal Medicine

## 2013-06-23 NOTE — Telephone Encounter (Signed)
Relevant patient education mailed to patient.  

## 2013-07-17 ENCOUNTER — Other Ambulatory Visit: Payer: Self-pay | Admitting: Internal Medicine

## 2013-07-17 ENCOUNTER — Ambulatory Visit (INDEPENDENT_AMBULATORY_CARE_PROVIDER_SITE_OTHER)
Admission: RE | Admit: 2013-07-17 | Discharge: 2013-07-17 | Disposition: A | Discharge status: Home / Self Care | Payer: 59 | Source: Ambulatory Visit | Attending: Internal Medicine | Admitting: Internal Medicine

## 2013-07-17 DIAGNOSIS — R0609 Other forms of dyspnea: Secondary | ICD-10-CM

## 2013-07-17 DIAGNOSIS — R0600 Dyspnea, unspecified: Secondary | ICD-10-CM

## 2013-07-17 DIAGNOSIS — R0989 Other specified symptoms and signs involving the circulatory and respiratory systems: Secondary | ICD-10-CM

## 2013-08-01 ENCOUNTER — Other Ambulatory Visit: Payer: Self-pay

## 2013-08-01 MED ORDER — ALPRAZOLAM 1 MG PO TABS: 0.5000 mg | ORAL_TABLET | Freq: Two times a day (BID) | ORAL | Status: DC | PRN

## 2013-08-01 NOTE — Telephone Encounter (Signed)
Ok to call in - see pending rx above - thanks

## 2013-08-01 NOTE — Telephone Encounter (Signed)
Called refill into pharmacy spoke with Delice Bisonara ok x's 1 per md. Updated EPIC...Raechel Chute/lmb

## 2013-08-01 NOTE — Telephone Encounter (Signed)
The patient is hoping to get a refill of her xanax.  She states she is going out of town tomorrow, and is need of the medication.    Pharmacy - [facility]   Callback [phone]- [phone]

## 2013-09-25 ENCOUNTER — Ambulatory Visit (INDEPENDENT_AMBULATORY_CARE_PROVIDER_SITE_OTHER): Payer: 59 | Admitting: Internal Medicine

## 2013-09-25 ENCOUNTER — Encounter: Payer: Self-pay | Admitting: Internal Medicine

## 2013-09-25 ENCOUNTER — Other Ambulatory Visit (INDEPENDENT_AMBULATORY_CARE_PROVIDER_SITE_OTHER): Payer: 59

## 2013-09-25 VITALS — BP 102/70 | HR 86 | Temp 98.6°F | Ht 65.0 in | Wt 139.6 lb

## 2013-09-25 DIAGNOSIS — Z0000 Encounter for general adult medical examination without abnormal findings: Secondary | ICD-10-CM

## 2013-09-25 DIAGNOSIS — R946 Abnormal results of thyroid function studies: Secondary | ICD-10-CM

## 2013-09-25 DIAGNOSIS — F411 Generalized anxiety disorder: Secondary | ICD-10-CM

## 2013-09-25 DIAGNOSIS — R634 Abnormal weight loss: Secondary | ICD-10-CM

## 2013-09-25 DIAGNOSIS — F419 Anxiety disorder, unspecified: Secondary | ICD-10-CM

## 2013-09-25 DIAGNOSIS — R7989 Other specified abnormal findings of blood chemistry: Secondary | ICD-10-CM

## 2013-09-25 LAB — HEPATIC FUNCTION PANEL
ALBUMIN: 4.2 g/dL (ref 3.5–5.2)
ALT: 14 U/L (ref 0–35)
AST: 21 U/L (ref 0–37)
Alkaline Phosphatase: 38 U/L — ABNORMAL LOW (ref 39–117)
BILIRUBIN TOTAL: 0.7 mg/dL (ref 0.3–1.2)
Bilirubin, Direct: 0.1 mg/dL (ref 0.0–0.3)
TOTAL PROTEIN: 7 g/dL (ref 6.0–8.3)

## 2013-09-25 LAB — BASIC METABOLIC PANEL
BUN: 11 mg/dL (ref 6–23)
CHLORIDE: 109 meq/L (ref 96–112)
CO2: 25 mEq/L (ref 19–32)
Calcium: 9.1 mg/dL (ref 8.4–10.5)
Creatinine, Ser: 0.7 mg/dL (ref 0.4–1.2)
GFR: 101.69 mL/min (ref >60.00)
GLUCOSE: 95 mg/dL (ref 70–99)
POTASSIUM: 3.9 meq/L (ref 3.5–5.1)
SODIUM: 140 meq/L (ref 135–145)

## 2013-09-25 LAB — LIPID PANEL
CHOLESTEROL: 162 mg/dL (ref 0–200)
HDL: 40.6 mg/dL (ref >39.00)
LDL Cholesterol: 107 mg/dL — ABNORMAL HIGH (ref 0–99)
TRIGLYCERIDES: 71 mg/dL (ref 0.0–149.0)
Total CHOL/HDL Ratio: 4
VLDL: 14.2 mg/dL (ref 0.0–40.0)

## 2013-09-25 LAB — CBC WITH DIFFERENTIAL/PLATELET
BASOS PCT: 0.4 % (ref 0.0–3.0)
Basophils Absolute: 0 10*3/uL (ref 0.0–0.1)
EOS PCT: 1.3 % (ref 0.0–5.0)
Eosinophils Absolute: 0.1 10*3/uL (ref 0.0–0.7)
HCT: 39.8 % (ref 36.0–46.0)
Hemoglobin: 13.2 g/dL (ref 12.0–15.0)
LYMPHS PCT: 15.6 % (ref 12.0–46.0)
Lymphs Abs: 0.8 10*3/uL (ref 0.7–4.0)
MCHC: 33.2 g/dL (ref 30.0–36.0)
MCV: 91.2 fl (ref 78.0–100.0)
Monocytes Absolute: 0.2 10*3/uL (ref 0.1–1.0)
Monocytes Relative: 3.8 % (ref 3.0–12.0)
NEUTROS PCT: 78.9 % — AB (ref 43.0–77.0)
Neutro Abs: 4.2 10*3/uL (ref 1.4–7.7)
Platelets: 216 10*3/uL (ref 150.0–400.0)
RBC: 4.37 Mil/uL (ref 3.87–5.11)
RDW: 14.2 % (ref 11.5–14.6)
WBC: 5.3 10*3/uL (ref 4.5–10.5)

## 2013-09-25 LAB — TSH: TSH: 0.23 u[IU]/mL — AB (ref 0.35–5.50)

## 2013-09-25 MED ORDER — ALPRAZOLAM 1 MG PO TABS: 0.5000 mg | ORAL_TABLET | Freq: Two times a day (BID) | ORAL | Status: DC | PRN

## 2013-09-25 NOTE — Progress Notes (Signed)
Pre visit review using our clinic review tool, if applicable. No additional management support is needed unless otherwise documented below in the visit note. 
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
## 2013-09-25 NOTE — Patient Instructions (Signed)
It was good to see you today.  We have reviewed your prior records including labs and tests today  Go down for labs today. Your results will be released to MyChart (or called to you) after review, usually within 72hours after test completion. If any changes need to be made, you will be notified at that same time.  Medications reviewed and updated, no changes recommended at this time. Refill on medication(s) as discussed today.   Please schedule followup in 3 months for weight check, call sooner if problems.
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
## 2013-09-28 DIAGNOSIS — R7989 Other specified abnormal findings of blood chemistry: Secondary | ICD-10-CM | POA: Insufficient documentation

## 2013-09-28 NOTE — Addendum Note (Signed)
Addended by: Rene PaciLESCHBER, VALERIE A on: 09/28/2013 06:07 AM   Modules accepted: Orders

## 2013-10-11 ENCOUNTER — Ambulatory Visit: Payer: 59 | Admitting: Internal Medicine

## 2013-10-20 ENCOUNTER — Telehealth: Payer: Self-pay

## 2013-10-20 ENCOUNTER — Other Ambulatory Visit (INDEPENDENT_AMBULATORY_CARE_PROVIDER_SITE_OTHER): Payer: 59 | Admitting: Internal Medicine

## 2013-10-20 DIAGNOSIS — R21 Rash and other nonspecific skin eruption: Secondary | ICD-10-CM

## 2013-10-20 MED ORDER — METHYLPREDNISOLONE ACETATE 80 MG/ML IJ SUSP
80.0000 mg | Freq: Once | INTRAMUSCULAR | Status: AC
  Administered 2013-10-20: 80 mg via INTRAMUSCULAR

## 2013-10-20 NOTE — Telephone Encounter (Signed)
Error

## 2013-10-31 ENCOUNTER — Telehealth: Payer: Self-pay | Admitting: Internal Medicine

## 2013-10-31 MED ORDER — FLUCONAZOLE 150 MG PO TABS: 150.0000 mg | ORAL_TABLET | Freq: Every day | ORAL | Status: DC

## 2013-10-31 NOTE — Telephone Encounter (Signed)
On doxycycline. Asks Rx diflucan for yeast. Plan- script to [facility] pharm

## 2013-11-06 ENCOUNTER — Ambulatory Visit: Payer: 59 | Admitting: Internal Medicine

## 2013-11-06 DIAGNOSIS — Z0289 Encounter for other administrative examinations: Secondary | ICD-10-CM

## 2013-11-14 ENCOUNTER — Other Ambulatory Visit: Payer: Self-pay | Admitting: Internal Medicine

## 2013-11-14 NOTE — Telephone Encounter (Signed)
MD out of office pls advise.../lmb 

## 2013-11-14 NOTE — Telephone Encounter (Signed)
Done hardcopy to robin  

## 2013-11-15 NOTE — Telephone Encounter (Signed)
Faxed hardcopy to [facility] [place]

## 2013-12-19 ENCOUNTER — Telehealth: Payer: Self-pay | Admitting: Internal Medicine

## 2013-12-19 ENCOUNTER — Other Ambulatory Visit: Payer: Self-pay | Admitting: *Deleted

## 2013-12-19 ENCOUNTER — Ambulatory Visit: Payer: 59 | Admitting: Internal Medicine

## 2013-12-19 DIAGNOSIS — R7989 Other specified abnormal findings of blood chemistry: Secondary | ICD-10-CM

## 2013-12-19 NOTE — Telephone Encounter (Signed)
Returned pt's call and advised her ok to have labs done at [facility]Dr [facility] office. Entered labs.

## 2013-12-19 NOTE — Telephone Encounter (Signed)
Patient want to know if she could get her labs drawn at [facility] Dr Sinclair [facility]oun[facility] office.  Please advise

## 2013-12-20 ENCOUNTER — Other Ambulatory Visit (INDEPENDENT_AMBULATORY_CARE_PROVIDER_SITE_OTHER): Payer: 59

## 2013-12-20 DIAGNOSIS — R7989 Other specified abnormal findings of blood chemistry: Secondary | ICD-10-CM

## 2013-12-20 DIAGNOSIS — R946 Abnormal results of thyroid function studies: Secondary | ICD-10-CM

## 2013-12-20 LAB — T4, FREE: Free T4: 1.23 ng/dL (ref 0.60–1.60)

## 2013-12-20 LAB — TSH: TSH: 1.12 u[IU]/mL (ref 0.35–4.50)

## 2014-01-15 ENCOUNTER — Ambulatory Visit: Payer: 59 | Admitting: Internal Medicine

## 2014-01-15 DIAGNOSIS — Z0289 Encounter for other administrative examinations: Secondary | ICD-10-CM

## 2014-02-14 ENCOUNTER — Other Ambulatory Visit: Payer: Self-pay | Admitting: Internal Medicine

## 2014-02-16 NOTE — Telephone Encounter (Signed)
OK #[phone] , NR 

## 2014-02-16 NOTE — Telephone Encounter (Signed)
MD out pls advise on refill...lmb 

## 2014-02-16 NOTE — Telephone Encounter (Signed)
Called refill into [facility] had to leave a msg on vm left msg authorization...Raechel Chute

## 2014-03-26 ENCOUNTER — Encounter: Payer: Self-pay | Admitting: Internal Medicine

## 2014-04-10 ENCOUNTER — Other Ambulatory Visit: Payer: Self-pay | Admitting: Internal Medicine

## 2014-05-31 ENCOUNTER — Other Ambulatory Visit: Payer: Self-pay | Admitting: Internal Medicine

## 2014-05-31 MED ORDER — DOXYCYCLINE HYCLATE 100 MG PO TABS: 100.0000 mg | ORAL_TABLET | Freq: Two times a day (BID) | ORAL | Status: DC

## 2014-06-01 ENCOUNTER — Telehealth: Payer: Self-pay | Admitting: Internal Medicine

## 2014-06-01 MED ORDER — DOXYCYCLINE HYCLATE 100 MG PO TABS: 100.0000 mg | ORAL_TABLET | Freq: Two times a day (BID) | ORAL | Status: DC

## 2014-06-01 NOTE — Telephone Encounter (Signed)
Requesting script for doxycycline for recurrent pustular skin infection. Seen at [facility] with that recommendation. Financial hardship.

## 2014-07-05 ENCOUNTER — Telehealth: Payer: Self-pay | Admitting: Internal Medicine

## 2014-07-05 MED ORDER — DOXYCYCLINE HYCLATE 100 MG PO TABS: 100.0000 mg | ORAL_TABLET | Freq: Two times a day (BID) | ORAL | Status: DC

## 2014-07-05 NOTE — Telephone Encounter (Signed)
Recurrent skin abscesses. Needs doxy to suppress. Chronic issue.

## 2014-08-06 ENCOUNTER — Telehealth: Payer: Self-pay | Admitting: Internal Medicine

## 2014-08-06 MED ORDER — AZITHROMYCIN 250 MG PO TABS: ORAL_TABLET | ORAL | Status: DC

## 2014-08-06 NOTE — Telephone Encounter (Signed)
Flare of hidratenitis skin lesions

## 2014-09-01 ENCOUNTER — Telehealth: Payer: Self-pay | Admitting: Internal Medicine

## 2014-09-01 MED ORDER — AMLODIPINE BESYLATE 10 MG PO TABS: ORAL_TABLET | ORAL | Status: DC

## 2014-09-01 NOTE — Telephone Encounter (Signed)
Asked script to resume this med used previously.

## 2014-09-06 ENCOUNTER — Telehealth: Payer: Self-pay | Admitting: Internal Medicine

## 2014-09-06 MED ORDER — ENALAPRIL MALEATE 5 MG PO TABS: 5.0000 mg | ORAL_TABLET | Freq: Every day | ORAL | Status: DC

## 2014-09-06 NOTE — Telephone Encounter (Signed)
Asked refill Rxs for BP meds to [facility] pharmacy- cost issue Managed by pcp

## 2014-09-11 ENCOUNTER — Telehealth: Payer: Self-pay | Admitting: Internal Medicine

## 2014-09-11 ENCOUNTER — Encounter: Payer: Self-pay | Admitting: Internal Medicine

## 2014-09-11 ENCOUNTER — Other Ambulatory Visit: Payer: Self-pay | Admitting: Internal Medicine

## 2014-09-11 MED ORDER — DOXYCYCLINE HYCLATE 100 MG PO TABS: 100.0000 mg | ORAL_TABLET | Freq: Two times a day (BID) | ORAL | Status: DC

## 2014-09-11 NOTE — Telephone Encounter (Signed)
Asks another round of doxycycline. Has another hidradinitis boil- on leg. Educated that smoking, tight clothes aggravate. Hope she can discuss long term management, maybe Humira, with Derm/ Immunology. Have offered referral to [facility] when she can.

## 2014-10-07 IMAGING — CR DG CHEST 2V
2 series · 2 of 2 positions shown · non-contrast
Comparison: None.

CLINICAL DATA: Difficulty swallowing

EXAM:
CHEST  2 VIEW

[view not recorded (1 of 2)]
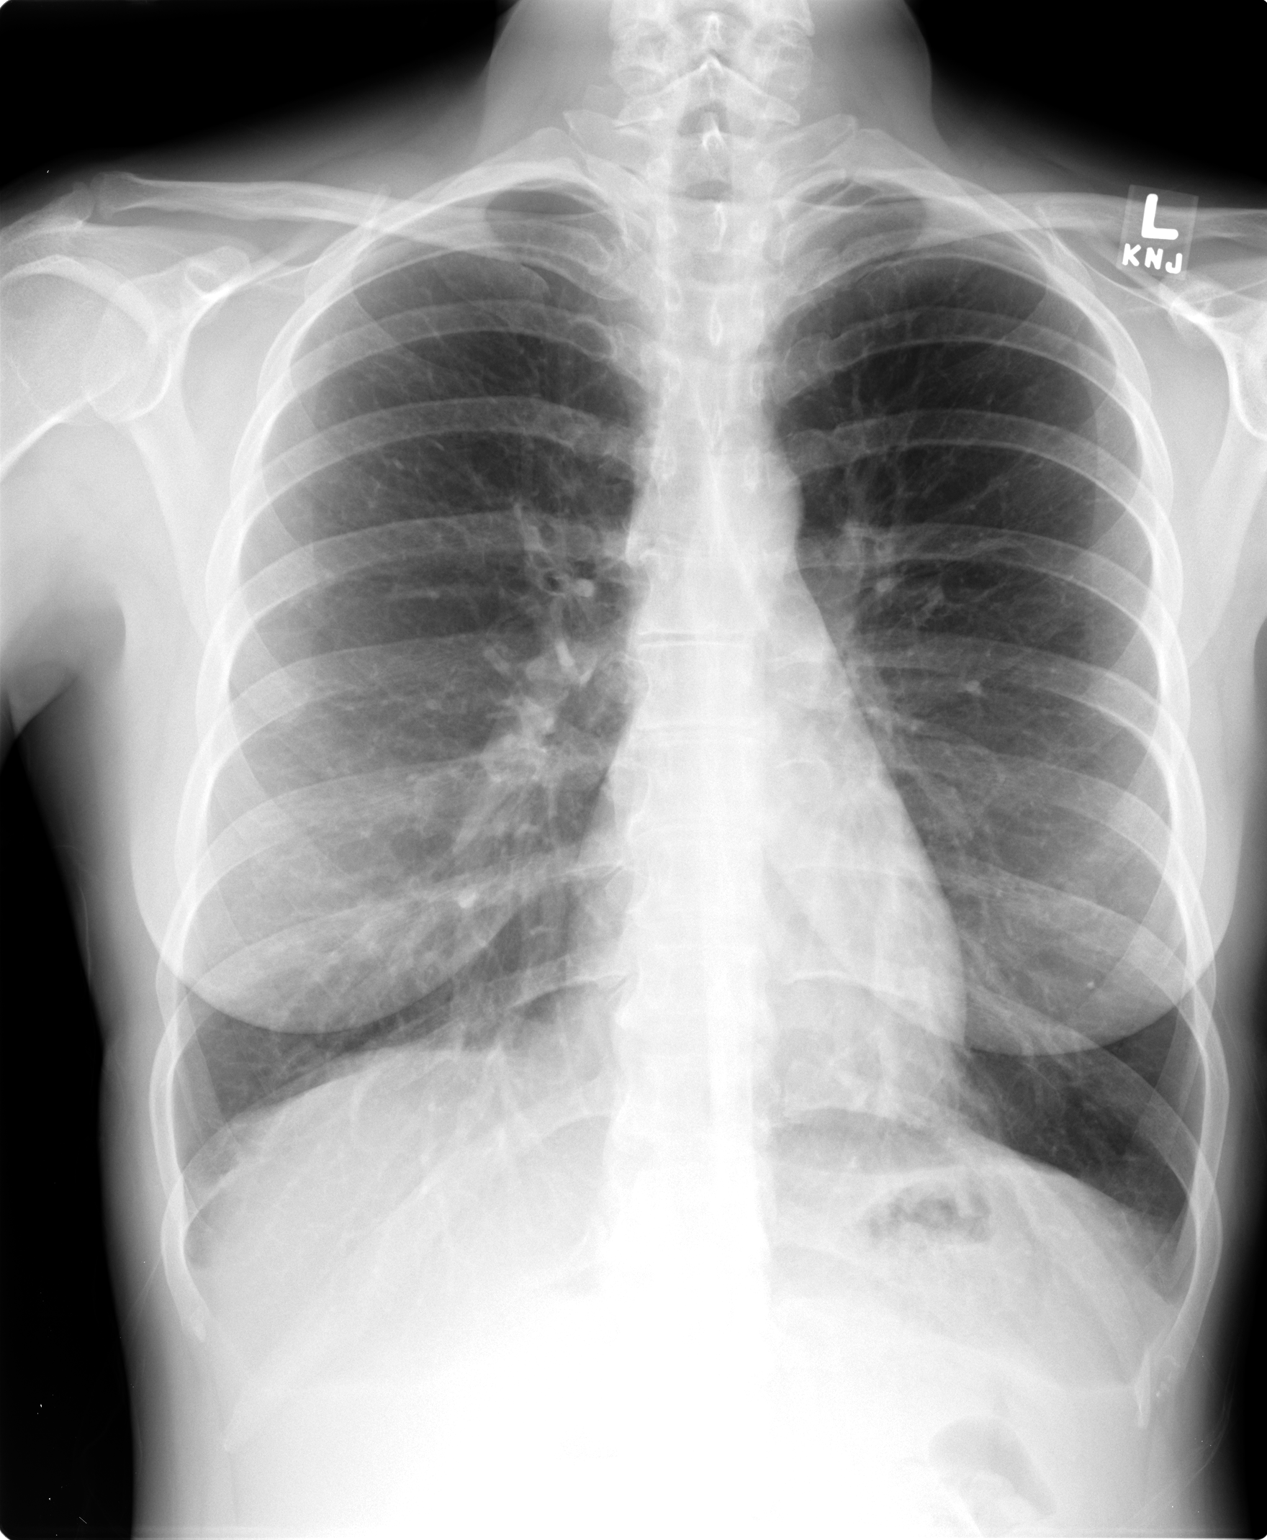

[view not recorded (2 of 2)]
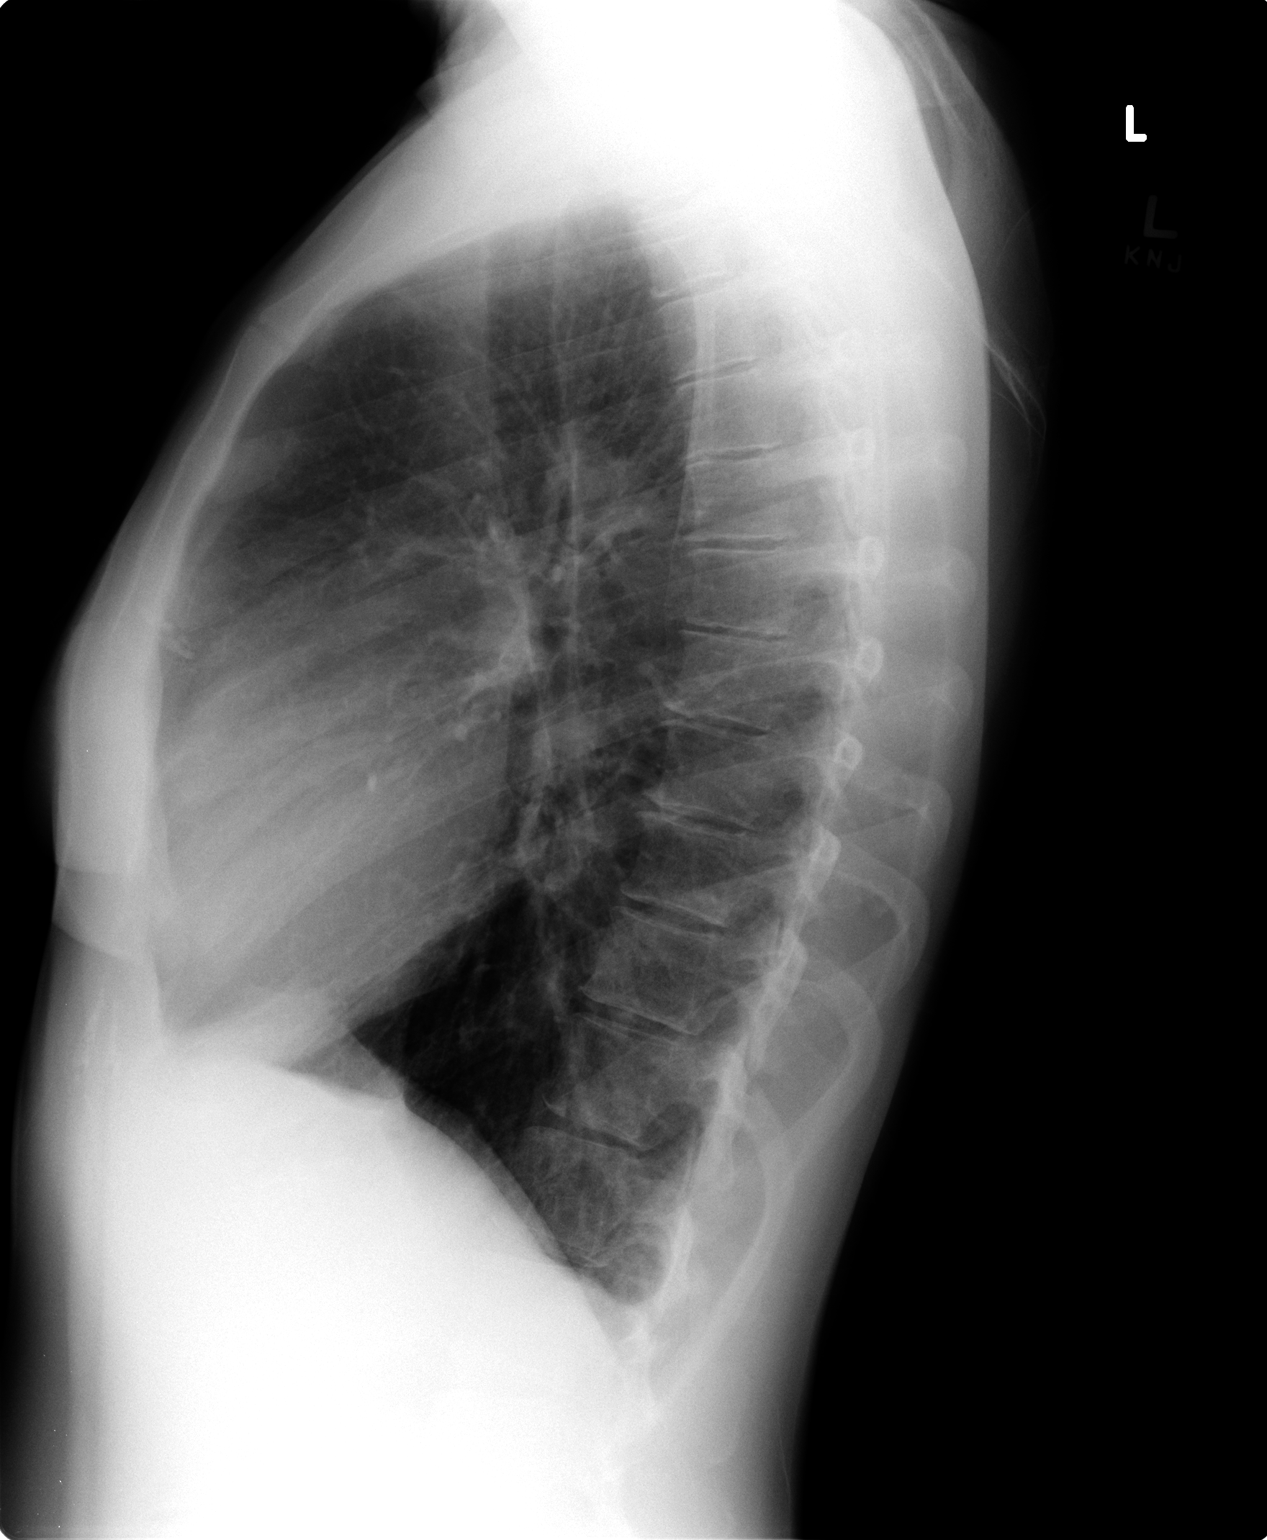

[2 of 2 positions shown; findings below may reference images not displayed]

FINDINGS: Cardiomediastinal silhouette is unremarkable. No acute infiltrate or
pleural effusion. No pulmonary edema. Mild degenerative changes mid
thoracic spine.
IMPRESSION: No active cardiopulmonary disease.

## 2014-10-17 ENCOUNTER — Telehealth: Payer: Self-pay | Admitting: Internal Medicine

## 2014-10-17 ENCOUNTER — Other Ambulatory Visit: Payer: Self-pay | Admitting: Internal Medicine

## 2014-10-17 MED ORDER — ENALAPRIL MALEATE 5 MG PO TABS: 5.0000 mg | ORAL_TABLET | Freq: Every day | ORAL | Status: DC

## 2014-10-17 MED ORDER — AMLODIPINE BESYLATE 10 MG PO TABS: ORAL_TABLET | ORAL | Status: DC

## 2014-10-17 NOTE — Telephone Encounter (Signed)
Asked refill BP meds Also given contact information at [facility] for dermatology to help with hidradenitis

## 2014-11-08 ENCOUNTER — Emergency Department (HOSPITAL_COMMUNITY)
Admission: EM | Admit: 2014-11-08 | Discharge: 2014-11-08 | Disposition: A | Discharge status: Home / Self Care | Payer: 59 | Attending: Emergency Medicine | Admitting: Emergency Medicine

## 2014-11-08 ENCOUNTER — Encounter (HOSPITAL_COMMUNITY): Payer: Self-pay | Admitting: Emergency Medicine

## 2014-11-08 DIAGNOSIS — F419 Anxiety disorder, unspecified: Secondary | ICD-10-CM | POA: Insufficient documentation

## 2014-11-08 DIAGNOSIS — Z8614 Personal history of Methicillin resistant Staphylococcus aureus infection: Secondary | ICD-10-CM | POA: Insufficient documentation

## 2014-11-08 DIAGNOSIS — Z79899 Other long term (current) drug therapy: Secondary | ICD-10-CM | POA: Insufficient documentation

## 2014-11-08 DIAGNOSIS — L0201 Cutaneous abscess of face: Secondary | ICD-10-CM | POA: Insufficient documentation

## 2014-11-08 DIAGNOSIS — Z720 Tobacco use: Secondary | ICD-10-CM | POA: Insufficient documentation

## 2014-11-08 MED ORDER — HYDROCODONE-ACETAMINOPHEN 5-325 MG PO TABS: 2.0000 | ORAL_TABLET | ORAL | Status: DC | PRN

## 2014-11-08 MED ORDER — CLINDAMYCIN HCL 150 MG PO CAPS: 150.0000 mg | ORAL_CAPSULE | Freq: Four times a day (QID) | ORAL | Status: DC

## 2014-11-08 MED ORDER — DOXYCYCLINE HYCLATE 100 MG PO CAPS: 100.0000 mg | ORAL_CAPSULE | Freq: Two times a day (BID) | ORAL | Status: DC

## 2014-11-08 NOTE — Discharge Instructions (Signed)

## 2014-11-08 NOTE — ED Notes (Addendum)
Pt here for evaluation of abscesses to face, 3 present at current time, has been treating at home for 2 weeks with warm compresses and has been taking doxycycline for 3 days. Pt has hx of MRSA. Pt reports the two on her cheeks are getting better but the one on her chin is the worst with lots of green drainage and a "hole in it." Pt a/o x4.
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
## 2015-01-10 ENCOUNTER — Encounter (HOSPITAL_COMMUNITY): Payer: Self-pay | Admitting: *Deleted

## 2015-01-10 ENCOUNTER — Emergency Department (HOSPITAL_COMMUNITY)
Admission: EM | Admit: 2015-01-10 | Discharge: 2015-01-10 | Disposition: A | Discharge status: Home / Self Care | Payer: Medicaid Other | Attending: Emergency Medicine | Admitting: Emergency Medicine

## 2015-01-10 DIAGNOSIS — L0291 Cutaneous abscess, unspecified: Secondary | ICD-10-CM

## 2015-01-10 DIAGNOSIS — L02511 Cutaneous abscess of right hand: Secondary | ICD-10-CM | POA: Insufficient documentation

## 2015-01-10 DIAGNOSIS — F419 Anxiety disorder, unspecified: Secondary | ICD-10-CM | POA: Diagnosis not present

## 2015-01-10 DIAGNOSIS — R200 Anesthesia of skin: Secondary | ICD-10-CM | POA: Diagnosis present

## 2015-01-10 DIAGNOSIS — Z792 Long term (current) use of antibiotics: Secondary | ICD-10-CM | POA: Insufficient documentation

## 2015-01-10 DIAGNOSIS — Z720 Tobacco use: Secondary | ICD-10-CM | POA: Diagnosis not present

## 2015-01-10 DIAGNOSIS — Z79899 Other long term (current) drug therapy: Secondary | ICD-10-CM | POA: Insufficient documentation

## 2015-01-10 DIAGNOSIS — Z8614 Personal history of Methicillin resistant Staphylococcus aureus infection: Secondary | ICD-10-CM | POA: Insufficient documentation

## 2015-01-10 MED ORDER — CLINDAMYCIN HCL 150 MG PO CAPS: 150.0000 mg | ORAL_CAPSULE | Freq: Four times a day (QID) | ORAL | Status: DC

## 2015-01-10 MED ORDER — IBUPROFEN 400 MG PO TABS
600.0000 mg | ORAL_TABLET | Freq: Once | ORAL | Status: AC
  Administered 2015-01-10: 600 mg via ORAL
  Filled 2015-01-10: qty 2
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
## 2015-01-10 NOTE — Discharge Instructions (Signed)
Incision and Drainage Incision and drainage is a procedure in which a sac-like structure (cystic structure) is opened and drained. The area to be drained usually contains material such as pus, fluid, or blood.  LET YOUR CAREGIVER KNOW ABOUT:   Allergies to medicine.  Medicines taken, including vitamins, herbs, eyedrops, over-the-counter medicines, and creams.  Use of steroids (by mouth or creams).  Previous problems with anesthetics or numbing medicines.  History of bleeding problems or blood clots.  Previous surgery.  Other health problems, including diabetes and kidney problems.  Possibility of pregnancy, if this applies. RISKS AND COMPLICATIONS  Pain.  Bleeding.  Scarring.  Infection. BEFORE THE PROCEDURE  You may need to have an ultrasound or other imaging tests to see how large or deep your cystic structure is. Blood tests may also be used to determine if you have an infection or how severe the infection is. You may need to have a tetanus shot. PROCEDURE  The affected area is cleaned with a cleaning fluid. The cyst area will then be numbed with a medicine (local anesthetic). A small incision will be made in the cystic structure. A syringe or catheter may be used to drain the contents of the cystic structure, or the contents may be squeezed out. The area will then be flushed with a cleansing solution. After cleansing the area, it is often gently packed with a gauze or another wound dressing. Once it is packed, it will be covered with gauze and tape or some other type of wound dressing. AFTER THE PROCEDURE   Often, you will be allowed to go home right after the procedure.  You may be given antibiotic medicine to prevent or heal an infection.  If the area was packed with gauze or some other wound dressing, you will likely need to come back in 1 to 2 days to get it removed.  The area should heal in about 14 days. Document Released: 11/04/2000 Document Revised: 11/10/2011  Document Reviewed: 07/06/2011 ExitCare Patient Information 2015 [employer]. This information is not intended to replace advice given to you by your health care provider. Make sure you discuss any questions you have with your health care provider.   Abscess An abscess is an infected area that contains a collection of pus and debris.It can occur in almost any part of the body. An abscess is also known as a furuncle or boil. CAUSES  An abscess occurs when tissue gets infected. This can occur from blockage of oil or sweat glands, infection of hair follicles, or a minor injury to the skin. As the body tries to fight the infection, pus collects in the area and creates pressure under the skin. This pressure causes pain. People with weakened immune systems have difficulty fighting infections and get certain abscesses more often.  SYMPTOMS Usually an abscess develops on the skin and becomes a painful mass that is red, warm, and tender. If the abscess forms under the skin, you may feel a moveable soft area under the skin. Some abscesses break open (rupture) on their own, but most will continue to get worse without care. The infection can spread deeper into the body and eventually into the bloodstream, causing you to feel ill.  DIAGNOSIS  Your caregiver will take your medical history and perform a physical exam. A sample of fluid may also be taken from the abscess to determine what is causing your infection. TREATMENT  Your caregiver may prescribe antibiotic medicines to fight the infection. However, taking antibiotics alone   usually does not cure an abscess. Your caregiver may need to make a small cut (incision) in the abscess to drain the pus. In some cases, gauze is packed into the abscess to reduce pain and to continue draining the area. HOME CARE INSTRUCTIONS   Only take over-the-counter or prescription medicines for pain, discomfort, or fever as directed by your caregiver.  If you were prescribed  antibiotics, take them as directed. Finish them even if you start to feel better.  If gauze is used, follow your caregiver's directions for changing the gauze.  To avoid spreading the infection:  Keep your draining abscess covered with a bandage.  Wash your hands well.  Do not share personal care items, towels, or whirlpools with others.  Avoid skin contact with others.  Keep your skin and clothes clean around the abscess.  Keep all follow-up appointments as directed by your caregiver. SEEK MEDICAL CARE IF:   You have increased pain, swelling, redness, fluid drainage, or bleeding.  You have muscle aches, chills, or a general ill feeling.  You have a fever. MAKE SURE YOU:   Understand these instructions.  Will watch your condition.  Will get help right away if you are not doing well or get worse. Document Released: 02/18/2005 Document Revised: 11/10/2011 Document Reviewed: 07/24/2011 ExitCare Patient Information 2015 [employer]. This information is not intended to replace advice given to you by your health care provider. Make sure you discuss any questions you have with your health care provider.  

## 2015-01-10 NOTE — ED Notes (Signed)
PT is here with right thumb numbness and appears to have a pocket of pus in the top of nail.  No injury.  Hx of mrsa

## 2015-01-28 ENCOUNTER — Emergency Department (HOSPITAL_COMMUNITY)
Admission: EM | Admit: 2015-01-28 | Discharge: 2015-01-28 | Disposition: A | Discharge status: Home / Self Care | Payer: Medicaid Other | Attending: Emergency Medicine | Admitting: Emergency Medicine

## 2015-01-28 ENCOUNTER — Encounter (HOSPITAL_COMMUNITY): Payer: Self-pay | Admitting: *Deleted

## 2015-01-28 DIAGNOSIS — Z8614 Personal history of Methicillin resistant Staphylococcus aureus infection: Secondary | ICD-10-CM | POA: Insufficient documentation

## 2015-01-28 DIAGNOSIS — Z79899 Other long term (current) drug therapy: Secondary | ICD-10-CM | POA: Insufficient documentation

## 2015-01-28 DIAGNOSIS — L0201 Cutaneous abscess of face: Secondary | ICD-10-CM | POA: Insufficient documentation

## 2015-01-28 DIAGNOSIS — Z720 Tobacco use: Secondary | ICD-10-CM | POA: Insufficient documentation

## 2015-01-28 DIAGNOSIS — F419 Anxiety disorder, unspecified: Secondary | ICD-10-CM | POA: Insufficient documentation

## 2015-01-28 HISTORY — DX: Methicillin resistant Staphylococcus aureus infection, unspecified site: A49.02

## 2015-01-28 MED ORDER — LIDOCAINE HCL 1 % IJ SOLN
INTRAMUSCULAR | Status: AC
  Administered 2015-01-28: 20 mL
  Filled 2015-01-28: qty 20

## 2015-01-28 MED ORDER — CLINDAMYCIN HCL 150 MG PO CAPS: 150.0000 mg | ORAL_CAPSULE | Freq: Four times a day (QID) | ORAL | Status: DC

## 2015-01-28 MED ORDER — HYDROCODONE-ACETAMINOPHEN 5-325 MG PO TABS: 2.0000 | ORAL_TABLET | ORAL | Status: DC | PRN

## 2015-01-28 MED ORDER — CEFTRIAXONE SODIUM 1 G IJ SOLR
1.0000 g | Freq: Once | INTRAMUSCULAR | Status: AC
  Administered 2015-01-28: 1 g via INTRAMUSCULAR
  Filled 2015-01-28: qty 10
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
## 2015-01-28 NOTE — Discharge Instructions (Signed)

## 2015-01-28 NOTE — ED Notes (Signed)
Pt reports abscesses to her face; pt states that she has had some to her cheek area and finished Doxycycline; pt states that 4 days ago she developed one on her forehead; pt states that it is hard and painful; pt states that she has been applying warm compresses with no relief

## 2015-04-08 ENCOUNTER — Telehealth: Payer: Self-pay | Admitting: Internal Medicine

## 2015-04-08 MED ORDER — DOXYCYCLINE HYCLATE 100 MG PO TABS: 100.0000 mg | ORAL_TABLET | Freq: Two times a day (BID) | ORAL | Status: DC

## 2015-04-08 NOTE — Telephone Encounter (Signed)
Large sty associated with hidradinitis Plan refillable doxycycline script

## 2015-04-24 ENCOUNTER — Emergency Department (HOSPITAL_COMMUNITY)
Admission: EM | Admit: 2015-04-24 | Discharge: 2015-04-25 | Disposition: A | Discharge status: Home / Self Care | Payer: Medicaid Other | Attending: Emergency Medicine | Admitting: Emergency Medicine

## 2015-04-24 ENCOUNTER — Encounter (HOSPITAL_COMMUNITY): Payer: Self-pay | Admitting: Emergency Medicine

## 2015-04-24 DIAGNOSIS — Z8619 Personal history of other infectious and parasitic diseases: Secondary | ICD-10-CM | POA: Diagnosis not present

## 2015-04-24 DIAGNOSIS — Z8659 Personal history of other mental and behavioral disorders: Secondary | ICD-10-CM | POA: Insufficient documentation

## 2015-04-24 DIAGNOSIS — F17210 Nicotine dependence, cigarettes, uncomplicated: Secondary | ICD-10-CM | POA: Diagnosis not present

## 2015-04-24 DIAGNOSIS — L0231 Cutaneous abscess of buttock: Secondary | ICD-10-CM | POA: Diagnosis not present

## 2015-04-24 DIAGNOSIS — L989 Disorder of the skin and subcutaneous tissue, unspecified: Secondary | ICD-10-CM | POA: Diagnosis present

## 2015-04-24 NOTE — ED Notes (Signed)
Pt states that she has hydradenitis and has boils on her L back, buttock and thigh. Alert and oriented.

## 2015-04-25 MED ORDER — DOXYCYCLINE HYCLATE 100 MG PO TABS
100.0000 mg | ORAL_TABLET | Freq: Once | ORAL | Status: AC
  Administered 2015-04-25: 100 mg via ORAL
  Filled 2015-04-25: qty 1

## 2015-04-25 MED ORDER — DOXYCYCLINE HYCLATE 100 MG PO CAPS: 100.0000 mg | ORAL_CAPSULE | Freq: Two times a day (BID) | ORAL | Status: DC

## 2015-04-25 MED ORDER — CLINDAMYCIN HCL 300 MG PO CAPS
300.0000 mg | ORAL_CAPSULE | Freq: Once | ORAL | Status: AC
  Administered 2015-04-25: 300 mg via ORAL
  Filled 2015-04-25: qty 1

## 2015-04-25 MED ORDER — HYDROCODONE-ACETAMINOPHEN 5-325 MG PO TABS: 1.0000 | ORAL_TABLET | ORAL | Status: DC | PRN

## 2015-04-25 MED ORDER — CLINDAMYCIN HCL 150 MG PO CAPS: 150.0000 mg | ORAL_CAPSULE | Freq: Four times a day (QID) | ORAL | Status: DC

## 2015-04-25 MED ORDER — HYDROCODONE-ACETAMINOPHEN 5-325 MG PO TABS
1.0000 | ORAL_TABLET | Freq: Once | ORAL | Status: AC
  Administered 2015-04-25: 1 via ORAL
  Filled 2015-04-25: qty 1

## 2015-04-25 NOTE — Discharge Instructions (Signed)
RETURN TO THE EMERGENCY DEPARTMENT WITH ANY WORSENING SYMPTOMS: HIGH FEVER, SEVERE PAIN. TAKE MEDICATIONS AS PRESCRIBED.   Abscess An abscess is an infected area that contains a collection of pus and debris.It can occur in almost any part of the body. An abscess is also known as a furuncle or boil. CAUSES  An abscess occurs when tissue gets infected. This can occur from blockage of oil or sweat glands, infection of hair follicles, or a minor injury to the skin. As the body tries to fight the infection, pus collects in the area and creates pressure under the skin. This pressure causes pain. People with weakened immune systems have difficulty fighting infections and get certain abscesses more often.  SYMPTOMS Usually an abscess develops on the skin and becomes a painful mass that is red, warm, and tender. If the abscess forms under the skin, you may feel a moveable soft area under the skin. Some abscesses break open (rupture) on their own, but most will continue to get worse without care. The infection can spread deeper into the body and eventually into the bloodstream, causing you to feel ill.  DIAGNOSIS  Your caregiver will take your medical history and perform a physical exam. A sample of fluid may also be taken from the abscess to determine what is causing your infection. TREATMENT  Your caregiver may prescribe antibiotic medicines to fight the infection. However, taking antibiotics alone usually does not cure an abscess. Your caregiver may need to make a small cut (incision) in the abscess to drain the pus. In some cases, gauze is packed into the abscess to reduce pain and to continue draining the area. HOME CARE INSTRUCTIONS   Only take over-the-counter or prescription medicines for pain, discomfort, or fever as directed by your caregiver.  If you were prescribed antibiotics, take them as directed. Finish them even if you start to feel better.  If gauze is used, follow your caregiver's  directions for changing the gauze.  To avoid spreading the infection:  Keep your draining abscess covered with a bandage.  Wash your hands well.  Do not share personal care items, towels, or whirlpools with others.  Avoid skin contact with others.  Keep your skin and clothes clean around the abscess.  Keep all follow-up appointments as directed by your caregiver. SEEK MEDICAL CARE IF:   You have increased pain, swelling, redness, fluid drainage, or bleeding.  You have muscle aches, chills, or a general ill feeling.  You have a fever. MAKE SURE YOU:   Understand these instructions.  Will watch your condition.  Will get help right away if you are not doing well or get worse.   This information is not intended to replace advice given to you by your health care provider. Make sure you discuss any questions you have with your health care provider.   Document Released: 02/18/2005 Document Revised: 11/10/2011 Document Reviewed: 07/24/2011 Elsevier Interactive Patient Education [employer]2016 [employer] Inc[employer]
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
## 2015-05-25 ENCOUNTER — Telehealth: Payer: Self-pay | Admitting: Internal Medicine

## 2015-05-25 MED ORDER — CLINDAMYCIN HCL 150 MG PO CAPS: 150.0000 mg | ORAL_CAPSULE | Freq: Four times a day (QID) | ORAL | Status: DC

## 2015-05-25 NOTE — Telephone Encounter (Signed)
Flare hidradenitis with facial lesions Asks cleocinRx

## 2015-06-09 ENCOUNTER — Telehealth: Payer: Self-pay | Admitting: Internal Medicine

## 2015-06-09 MED ORDER — CLINDAMYCIN HCL 150 MG PO CAPS: 150.0000 mg | ORAL_CAPSULE | Freq: Four times a day (QID) | ORAL | Status: DC

## 2015-06-09 NOTE — Telephone Encounter (Signed)
Asks clindamycin for hidradenitis

## 2015-06-23 ENCOUNTER — Telehealth: Payer: Self-pay | Admitting: Internal Medicine

## 2015-06-23 MED ORDER — LOTEPREDNOL-TOBRAMYCIN 0.5-0.3 % OP SUSP: 1.0000 [drp] | Freq: Four times a day (QID) | OPHTHALMIC | Status: DC

## 2015-06-23 NOTE — Telephone Encounter (Signed)
Rx Zylet opth suspension to [facility]

## 2015-12-08 ENCOUNTER — Emergency Department (HOSPITAL_COMMUNITY)
Admission: EM | Admit: 2015-12-08 | Discharge: 2015-12-08 | Disposition: A | Discharge status: Home / Self Care | Payer: 59 | Attending: Dermatology | Admitting: Dermatology

## 2015-12-08 DIAGNOSIS — F17210 Nicotine dependence, cigarettes, uncomplicated: Secondary | ICD-10-CM | POA: Insufficient documentation

## 2015-12-08 DIAGNOSIS — Z5321 Procedure and treatment not carried out due to patient leaving prior to being seen by health care provider: Secondary | ICD-10-CM | POA: Insufficient documentation

## 2015-12-08 DIAGNOSIS — L0291 Cutaneous abscess, unspecified: Secondary | ICD-10-CM | POA: Insufficient documentation

## 2015-12-09 ENCOUNTER — Telehealth: Payer: Self-pay | Admitting: Internal Medicine

## 2015-12-09 MED ORDER — DOXYCYCLINE HYCLATE 100 MG PO CAPS: 100.0000 mg | ORAL_CAPSULE | Freq: Two times a day (BID) | ORAL | Status: DC

## 2015-12-09 MED ORDER — CLINDAMYCIN HCL 150 MG PO CAPS: 150.0000 mg | ORAL_CAPSULE | Freq: Four times a day (QID) | ORAL | Status: DC

## 2015-12-09 NOTE — Telephone Encounter (Signed)
Requesting doxycycline and clindamycin for hidradenitis breakouts. Moved back to area. Needs help till re-established with dermatology.

## 2015-12-10 ENCOUNTER — Other Ambulatory Visit: Payer: Self-pay | Admitting: Internal Medicine

## 2015-12-11 ENCOUNTER — Emergency Department (HOSPITAL_COMMUNITY): Admission: EM | Admit: 2015-12-11 | Discharge: 2015-12-12 | Discharge status: Against Medical Advice | Payer: 59

## 2015-12-12 ENCOUNTER — Encounter (HOSPITAL_COMMUNITY): Payer: Self-pay | Admitting: Emergency Medicine

## 2015-12-12 DIAGNOSIS — L03311 Cellulitis of abdominal wall: Secondary | ICD-10-CM | POA: Insufficient documentation

## 2015-12-12 DIAGNOSIS — Z5321 Procedure and treatment not carried out due to patient leaving prior to being seen by health care provider: Secondary | ICD-10-CM | POA: Insufficient documentation

## 2015-12-12 DIAGNOSIS — F17210 Nicotine dependence, cigarettes, uncomplicated: Secondary | ICD-10-CM | POA: Insufficient documentation

## 2015-12-12 LAB — CBC WITH DIFFERENTIAL/PLATELET
BASOS PCT: 0 %
Basophils Absolute: 0 10*3/uL (ref 0.0–0.1)
Eosinophils Absolute: 0.2 10*3/uL (ref 0.0–0.7)
Eosinophils Relative: 2 %
HEMATOCRIT: 36.9 % (ref 36.0–46.0)
HEMOGLOBIN: 12.3 g/dL (ref 12.0–15.0)
Lymphocytes Relative: 16 %
Lymphs Abs: 1.4 10*3/uL (ref 0.7–4.0)
MCH: 30.5 pg (ref 26.0–34.0)
MCHC: 33.3 g/dL (ref 30.0–36.0)
MCV: 91.6 fL (ref 78.0–100.0)
MONOS PCT: 8 %
Monocytes Absolute: 0.7 10*3/uL (ref 0.1–1.0)
NEUTROS ABS: 6.6 10*3/uL (ref 1.7–7.7)
NEUTROS PCT: 74 %
Platelets: 170 10*3/uL (ref 150–400)
RBC: 4.03 MIL/uL (ref 3.87–5.11)
RDW: 13.4 % (ref 11.5–15.5)
WBC: 8.9 10*3/uL (ref 4.0–10.5)

## 2015-12-12 LAB — BASIC METABOLIC PANEL
ANION GAP: 6 (ref 5–15)
BUN: 15 mg/dL (ref 6–20)
CHLORIDE: 105 mmol/L (ref 101–111)
CO2: 26 mmol/L (ref 22–32)
CREATININE: 0.92 mg/dL (ref 0.44–1.00)
Calcium: 8.9 mg/dL (ref 8.9–10.3)
GFR calc non Af Amer: >60 mL/min (ref >60)
Glucose, Bld: 116 mg/dL — ABNORMAL HIGH (ref 65–99)
POTASSIUM: 3.5 mmol/L (ref 3.5–5.1)
SODIUM: 137 mmol/L (ref 135–145)

## 2015-12-12 LAB — I-STAT CG4 LACTIC ACID, ED: LACTIC ACID, VENOUS: 0.49 mmol/L — AB (ref 0.5–1.9)

## 2015-12-12 LAB — I-STAT BETA HCG BLOOD, ED (MC, WL, AP ONLY)

## 2015-12-12 NOTE — ED Notes (Signed)
Pt. reports worsening skin infection at right lower abdomen onset 2 weeks ago , currently taking oral antibiotics with no improvement , denies fever or chills.

## 2015-12-13 ENCOUNTER — Emergency Department (HOSPITAL_COMMUNITY)
Admission: EM | Admit: 2015-12-13 | Discharge: 2015-12-13 | Disposition: A | Discharge status: Home / Self Care | Payer: 59 | Attending: Dermatology | Admitting: Dermatology

## 2015-12-13 NOTE — ED Notes (Signed)
Pts name called for a room no answer 

## 2015-12-17 ENCOUNTER — Encounter (HOSPITAL_BASED_OUTPATIENT_CLINIC_OR_DEPARTMENT_OTHER): Payer: Self-pay

## 2015-12-17 ENCOUNTER — Emergency Department (HOSPITAL_BASED_OUTPATIENT_CLINIC_OR_DEPARTMENT_OTHER)
Admission: EM | Admit: 2015-12-17 | Discharge: 2015-12-17 | Disposition: A | Discharge status: Home / Self Care | Payer: 59 | Attending: Emergency Medicine | Admitting: Emergency Medicine

## 2015-12-17 ENCOUNTER — Emergency Department (HOSPITAL_COMMUNITY)
Admission: EM | Admit: 2015-12-17 | Discharge: 2015-12-17 | Disposition: A | Discharge status: Home / Self Care | Payer: 59 | Attending: Emergency Medicine | Admitting: Emergency Medicine

## 2015-12-17 ENCOUNTER — Encounter (HOSPITAL_COMMUNITY): Payer: Self-pay | Admitting: *Deleted

## 2015-12-17 DIAGNOSIS — Z5321 Procedure and treatment not carried out due to patient leaving prior to being seen by health care provider: Secondary | ICD-10-CM | POA: Insufficient documentation

## 2015-12-17 DIAGNOSIS — L0291 Cutaneous abscess, unspecified: Secondary | ICD-10-CM

## 2015-12-17 DIAGNOSIS — F17210 Nicotine dependence, cigarettes, uncomplicated: Secondary | ICD-10-CM | POA: Insufficient documentation

## 2015-12-17 DIAGNOSIS — L02215 Cutaneous abscess of perineum: Secondary | ICD-10-CM | POA: Insufficient documentation

## 2015-12-17 DIAGNOSIS — L02214 Cutaneous abscess of groin: Secondary | ICD-10-CM | POA: Insufficient documentation

## 2015-12-17 DIAGNOSIS — L02213 Cutaneous abscess of chest wall: Secondary | ICD-10-CM | POA: Insufficient documentation

## 2015-12-17 LAB — COMPREHENSIVE METABOLIC PANEL
ALT: 13 U/L — ABNORMAL LOW (ref 14–54)
ANION GAP: 5 (ref 5–15)
AST: 15 U/L (ref 15–41)
Albumin: 3.1 g/dL — ABNORMAL LOW (ref 3.5–5.0)
Alkaline Phosphatase: 50 U/L (ref 38–126)
BILIRUBIN TOTAL: 0.3 mg/dL (ref 0.3–1.2)
BUN: 16 mg/dL (ref 6–20)
CALCIUM: 8.9 mg/dL (ref 8.9–10.3)
CO2: 28 mmol/L (ref 22–32)
Chloride: 105 mmol/L (ref 101–111)
Creatinine, Ser: 0.92 mg/dL (ref 0.44–1.00)
GFR calc Af Amer: >60 mL/min (ref >60)
Glucose, Bld: 106 mg/dL — ABNORMAL HIGH (ref 65–99)
POTASSIUM: 3.7 mmol/L (ref 3.5–5.1)
Sodium: 138 mmol/L (ref 135–145)
TOTAL PROTEIN: 6 g/dL — AB (ref 6.5–8.1)

## 2015-12-17 LAB — CBC WITH DIFFERENTIAL/PLATELET
Basophils Absolute: 0 10*3/uL (ref 0.0–0.1)
Basophils Relative: 0 %
EOS ABS: 0.2 10*3/uL (ref 0.0–0.7)
EOS PCT: 3 %
HCT: 37 % (ref 36.0–46.0)
Hemoglobin: 12.1 g/dL (ref 12.0–15.0)
LYMPHS ABS: 1.6 10*3/uL (ref 0.7–4.0)
Lymphocytes Relative: 20 %
MCH: 29.8 pg (ref 26.0–34.0)
MCHC: 32.7 g/dL (ref 30.0–36.0)
MCV: 91.1 fL (ref 78.0–100.0)
MONO ABS: 0.7 10*3/uL (ref 0.1–1.0)
Monocytes Relative: 9 %
Neutro Abs: 5.3 10*3/uL (ref 1.7–7.7)
Neutrophils Relative %: 68 %
PLATELETS: 219 10*3/uL (ref 150–400)
RBC: 4.06 MIL/uL (ref 3.87–5.11)
RDW: 13 % (ref 11.5–15.5)
WBC: 7.8 10*3/uL (ref 4.0–10.5)

## 2015-12-17 LAB — URINALYSIS, ROUTINE W REFLEX MICROSCOPIC
BILIRUBIN URINE: NEGATIVE
GLUCOSE, UA: NEGATIVE mg/dL
HGB URINE DIPSTICK: NEGATIVE
KETONES UR: NEGATIVE mg/dL
Leukocytes, UA: NEGATIVE
Nitrite: NEGATIVE
PROTEIN: NEGATIVE mg/dL
Specific Gravity, Urine: 1.02 (ref 1.005–1.030)
pH: 7.5 (ref 5.0–8.0)

## 2015-12-17 LAB — I-STAT BETA HCG BLOOD, ED (MC, WL, AP ONLY)

## 2015-12-17 LAB — I-STAT CG4 LACTIC ACID, ED: LACTIC ACID, VENOUS: 0.71 mmol/L (ref 0.5–1.9)

## 2015-12-17 MED ORDER — HYDROCODONE-ACETAMINOPHEN 5-325 MG PO TABS
2.0000 | ORAL_TABLET | Freq: Once | ORAL | Status: AC
  Administered 2015-12-17: 2 via ORAL
  Filled 2015-12-17: qty 2

## 2015-12-17 MED ORDER — PENTAFLUOROPROP-TETRAFLUOROETH EX AERO
INHALATION_SPRAY | CUTANEOUS | Status: AC
  Administered 2015-12-17: 12:00:00
  Filled 2015-12-17: qty 30

## 2015-12-17 MED ORDER — HYDROCODONE-ACETAMINOPHEN 5-325 MG PO TABS: 1.0000 | ORAL_TABLET | Freq: Four times a day (QID) | ORAL | 0 refills | Status: DC | PRN

## 2015-12-17 MED ORDER — LIDOCAINE-EPINEPHRINE 2 %-1:100000 IJ SOLN
20.0000 mL | Freq: Once | INTRAMUSCULAR | Status: AC
  Administered 2015-12-17: 20 mL
  Filled 2015-12-17: qty 1

## 2015-12-17 MED FILL — HYDROCODON-APAP 5-325: 5-325 | 2 days supply | Qty: 15 | Fill #0

## 2015-12-17 NOTE — ED Triage Notes (Signed)
Pt reports she is MRSA pos-abscess to right groin area x 2 weeks-reports hx of multiple abscesses-has "ongoing" rx for doxycycline and clindamycin that she starts at home

## 2015-12-17 NOTE — ED Notes (Signed)
Patient called to triage room, Patient family member states she is in the restroom at this time.

## 2015-12-17 NOTE — ED Notes (Signed)
Pt called for in waiting area x2 to be taken to a room. No Response or answer from pt.

## 2015-12-17 NOTE — ED Notes (Signed)
Pt teaching provided on medications that may cause drowsiness. Pt instructed not to drive or operate heavy machinery while taking the prescribed medication. Pt verbalized understanding.   
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
## 2015-12-17 NOTE — ED Triage Notes (Signed)
Pt has abscesses on chest and right groin. Pt states she was dx MRSA. Pt currently taking doxycycline and clindamycin.

## 2015-12-18 LAB — URINE CULTURE: Culture: 20000 — AB

## 2015-12-19 ENCOUNTER — Encounter (HOSPITAL_COMMUNITY): Payer: Self-pay | Admitting: Emergency Medicine

## 2015-12-19 ENCOUNTER — Emergency Department (HOSPITAL_COMMUNITY)
Admission: EM | Admit: 2015-12-19 | Discharge: 2015-12-19 | Disposition: A | Discharge status: Home / Self Care | Payer: 59 | Attending: Dermatology | Admitting: Dermatology

## 2015-12-19 DIAGNOSIS — F17210 Nicotine dependence, cigarettes, uncomplicated: Secondary | ICD-10-CM | POA: Diagnosis not present

## 2015-12-19 DIAGNOSIS — Z5321 Procedure and treatment not carried out due to patient leaving prior to being seen by health care provider: Secondary | ICD-10-CM | POA: Diagnosis not present

## 2015-12-19 DIAGNOSIS — L02214 Cutaneous abscess of groin: Secondary | ICD-10-CM | POA: Insufficient documentation

## 2015-12-19 NOTE — ED Triage Notes (Signed)
Pt reports she had R groin abscess drained 2 days ago. Drain stayed open for a day and then closed back over. Pain and swelling have gotten worse.

## 2015-12-19 NOTE — ED Triage Notes (Signed)
Pt called from lobby x3 No response 

## 2015-12-19 NOTE — ED Triage Notes (Signed)
Pt called to take to treatment room without response from lobby

## 2016-02-19 ENCOUNTER — Encounter (HOSPITAL_BASED_OUTPATIENT_CLINIC_OR_DEPARTMENT_OTHER): Payer: Self-pay | Admitting: *Deleted

## 2016-02-19 ENCOUNTER — Emergency Department (HOSPITAL_BASED_OUTPATIENT_CLINIC_OR_DEPARTMENT_OTHER)
Admission: EM | Admit: 2016-02-19 | Discharge: 2016-02-19 | Disposition: A | Discharge status: Home / Self Care | Payer: 59 | Attending: Dermatology | Admitting: Dermatology

## 2016-02-19 DIAGNOSIS — Z79899 Other long term (current) drug therapy: Secondary | ICD-10-CM | POA: Insufficient documentation

## 2016-02-19 DIAGNOSIS — Z5321 Procedure and treatment not carried out due to patient leaving prior to being seen by health care provider: Secondary | ICD-10-CM | POA: Insufficient documentation

## 2016-02-19 DIAGNOSIS — L02414 Cutaneous abscess of left upper limb: Secondary | ICD-10-CM | POA: Insufficient documentation

## 2016-02-19 DIAGNOSIS — F17210 Nicotine dependence, cigarettes, uncomplicated: Secondary | ICD-10-CM | POA: Insufficient documentation

## 2016-02-19 NOTE — ED Notes (Signed)
Pt seen walking out of the ED. No distress noted

## 2016-02-19 NOTE — ED Triage Notes (Addendum)
Abscess under left arm x several days. Pt noted to be on her cell phone throughout the entire triage.

## 2016-02-23 ENCOUNTER — Encounter (HOSPITAL_BASED_OUTPATIENT_CLINIC_OR_DEPARTMENT_OTHER): Payer: Self-pay | Admitting: Emergency Medicine

## 2016-02-23 ENCOUNTER — Emergency Department (HOSPITAL_BASED_OUTPATIENT_CLINIC_OR_DEPARTMENT_OTHER)
Admission: EM | Admit: 2016-02-23 | Discharge: 2016-02-23 | Disposition: A | Discharge status: Home / Self Care | Payer: 59 | Attending: Dermatology | Admitting: Dermatology

## 2016-02-23 DIAGNOSIS — F17210 Nicotine dependence, cigarettes, uncomplicated: Secondary | ICD-10-CM | POA: Insufficient documentation

## 2016-02-23 DIAGNOSIS — Z79899 Other long term (current) drug therapy: Secondary | ICD-10-CM | POA: Insufficient documentation

## 2016-02-23 DIAGNOSIS — Z5321 Procedure and treatment not carried out due to patient leaving prior to being seen by health care provider: Secondary | ICD-10-CM | POA: Insufficient documentation

## 2016-02-23 DIAGNOSIS — L0201 Cutaneous abscess of face: Secondary | ICD-10-CM | POA: Insufficient documentation

## 2016-02-23 NOTE — ED Triage Notes (Signed)
Patient with multiple abscess. One to face, several to both axilla, and one to the right leg. Patient also reports fever at home, max 99, taking tylenol, last dose 2 hours ago. Patient was here a few days ago for same, LWBS, also seen for same in July 2017. Patient reports Hx of MRSA.

## 2016-02-23 NOTE — ED Triage Notes (Signed)
Patient called to triage x3, no answer. LWBS at this time.

## 2016-02-23 NOTE — ED Triage Notes (Signed)
Called to treatment room, no answer 

## 2016-02-23 NOTE — ED Triage Notes (Signed)
Patient called to treatment room x2, no answer.  

## 2016-02-24 ENCOUNTER — Emergency Department (HOSPITAL_COMMUNITY): Admission: EM | Admit: 2016-02-24 | Discharge: 2016-02-24 | Discharge status: Against Medical Advice | Payer: 59

## 2016-02-24 NOTE — ED Notes (Signed)
THIRD attempt to call pt to triage; no response.

## 2016-02-24 NOTE — ED Notes (Signed)
Second attempt PT did not answer for triage

## 2016-02-24 NOTE — ED Notes (Addendum)
PT did not answer to name for triage

## 2016-04-12 ENCOUNTER — Emergency Department (HOSPITAL_COMMUNITY)
Admission: EM | Admit: 2016-04-12 | Discharge: 2016-04-12 | Discharge status: Against Medical Advice | Payer: 59 | Attending: Emergency Medicine | Admitting: Emergency Medicine

## 2016-04-12 ENCOUNTER — Encounter (HOSPITAL_COMMUNITY): Payer: Self-pay | Admitting: Emergency Medicine

## 2016-04-12 DIAGNOSIS — L02512 Cutaneous abscess of left hand: Secondary | ICD-10-CM | POA: Insufficient documentation

## 2016-04-12 DIAGNOSIS — F17210 Nicotine dependence, cigarettes, uncomplicated: Secondary | ICD-10-CM | POA: Insufficient documentation

## 2016-04-12 MED ORDER — LIDOCAINE HCL 2 % IJ SOLN
15.0000 mL | Freq: Once | INTRAMUSCULAR | Status: DC
  Filled 2016-04-12: qty 20
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
## 2016-04-12 NOTE — ED Triage Notes (Signed)
Pt reports lesions all over body that have been treated with antibiotic.  Nickel-sized lesion with yellow crust on left ring finger.  Pt reports that finger has had inc pain, redness, and swelling over last week.  Full ROM, pulses, strength, and sensation in hand.  Denies drainage or recent injury.

## 2016-04-12 NOTE — ED Notes (Signed)
ED Provider at bedside. 

## 2016-04-12 NOTE — ED Notes (Signed)
Pt has abscess to 4th digit on left finger. States this has been there for 1 week, has decrease mobility to hand. She admits that she has h/o MRSA and never has had abscess on face and finger. Has noticed more to appear on body: Rt forearm, lf chest.

## 2016-04-12 NOTE — ED Notes (Addendum)
Pt walked out of the room and down hallway toward check-out. Asked patient if she was leaving. She stated she was going outside to smoke a cigarette. Informed patient that was against policy, she stated that she would be back later.

## 2016-07-16 ENCOUNTER — Encounter (HOSPITAL_COMMUNITY): Payer: Self-pay | Admitting: Emergency Medicine

## 2016-07-16 ENCOUNTER — Emergency Department (HOSPITAL_COMMUNITY)
Admission: EM | Admit: 2016-07-16 | Discharge: 2016-07-17 | Discharge status: Against Medical Advice | Payer: 59 | Attending: Emergency Medicine | Admitting: Emergency Medicine

## 2016-07-16 DIAGNOSIS — F17210 Nicotine dependence, cigarettes, uncomplicated: Secondary | ICD-10-CM | POA: Insufficient documentation

## 2016-07-16 DIAGNOSIS — L03011 Cellulitis of right finger: Secondary | ICD-10-CM | POA: Insufficient documentation

## 2016-07-16 MED ORDER — LIDOCAINE HCL 2 % IJ SOLN
10.0000 mL | Freq: Once | INTRAMUSCULAR | Status: DC
  Filled 2016-07-16: qty 20
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
## 2016-07-16 NOTE — ED Triage Notes (Addendum)
Pt c/o sores all over her body st's she has MRSA  Pt st;s she started taking Doxy yesterday.  Pt c/o pain all over and head pain

## 2016-07-17 NOTE — ED Notes (Signed)
Pt has emerged from room & walked to restroom X5. Pt carrying purse with her each time she goes.

## 2016-07-17 NOTE — ED Notes (Signed)
EDP went to room to perform I&D. Pt no where to be found. Checked restrooms and called to nurse first.

## 2016-07-18 ENCOUNTER — Emergency Department (HOSPITAL_COMMUNITY)
Admission: EM | Admit: 2016-07-18 | Discharge: 2016-07-18 | Discharge status: Against Medical Advice | Payer: 59 | Attending: Emergency Medicine | Admitting: Emergency Medicine

## 2016-07-18 ENCOUNTER — Encounter (HOSPITAL_COMMUNITY): Payer: Self-pay | Admitting: *Deleted

## 2016-07-18 DIAGNOSIS — L03019 Cellulitis of unspecified finger: Secondary | ICD-10-CM

## 2016-07-18 DIAGNOSIS — L03011 Cellulitis of right finger: Secondary | ICD-10-CM | POA: Insufficient documentation

## 2016-07-18 DIAGNOSIS — F17210 Nicotine dependence, cigarettes, uncomplicated: Secondary | ICD-10-CM | POA: Insufficient documentation

## 2016-07-18 MED ORDER — LIDOCAINE HCL (PF) 1 % IJ SOLN
30.0000 mL | Freq: Once | INTRAMUSCULAR | Status: DC
  Filled 2016-07-18: qty 30

## 2016-07-18 NOTE — ED Triage Notes (Signed)
Pt is back here to have abscess on her right index finger drained.  [facility] where they wanted to drain this but pt was scared and wanted to give it an extra day to see if it would drain on its own.  Area is not draining and has gotten bigger since yesterday with increasing pain.  Pt denies any fever or chills.  She is on Doxy for her MRSA
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
## 2016-07-18 NOTE — ED Notes (Signed)
Pt left without having abscess drained/ without receiving treatment. Pt was informed of risks of leaving-pt continues to leave. Per pt " my ride is here I have to go". Pt asked to stay numerous times. Unable to obtain AMA/Elopement signature or vitals. Pt left with family-ambulatory, NAD noted.
